# Patient Record
Sex: Female | Born: 2000 | Race: Black or African American | Hispanic: No | Marital: Married | Smoking: Never smoker
Health system: Southern US, Community
[De-identification: ages and names within clinical notes are randomized; demographics above are authoritative.]

## PROBLEM LIST (undated history)

## (undated) DIAGNOSIS — F4010 Social phobia, unspecified: Secondary | ICD-10-CM

## (undated) DIAGNOSIS — F39 Unspecified mood [affective] disorder: Secondary | ICD-10-CM

## (undated) DIAGNOSIS — F32A Depression, unspecified: Secondary | ICD-10-CM

## (undated) DIAGNOSIS — F419 Anxiety disorder, unspecified: Secondary | ICD-10-CM

## (undated) DIAGNOSIS — G90A Postural orthostatic tachycardia syndrome (POTS): Secondary | ICD-10-CM

## (undated) DIAGNOSIS — F329 Major depressive disorder, single episode, unspecified: Secondary | ICD-10-CM

## (undated) DIAGNOSIS — L309 Dermatitis, unspecified: Secondary | ICD-10-CM

## (undated) HISTORY — PX: NO PAST SURGERIES: SHX2092

## (undated) HISTORY — DX: Postural orthostatic tachycardia syndrome (POTS): G90.A

---
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
## 2020-06-30 NOTE — L&D Delivery Note (Signed)
Delivery Note Donna Diaz is a 20 y.o. G1P0 at [redacted]w[redacted]d admitted for IOL d/t A2DM.   GBS Status: Positive/-- (10/06 1615) Maximum Maternal Temperature: 100.5  Labor course: Initial SVE: 0/th/-3. Augmentation with: Pitocin, Cytotec, IP Foley, and SROM . She then progressed to complete.  ROM: 32h 59m with clear fluid  Birth: At 0349 a viable female was delivered via spontaneous vaginal delivery (Presentation: LOA). Nuchal cord present: Yes x 2. Shoulders not forthcoming, placed in McRobert's, still not forthcoming, so grasped posterior (Lt) axilla and rotated to deliver anterior arm. Nuchal x 2 reduced immediately after birth. Infant placed directly on mom's abdomen for bonding/skin-to-skin, baby dried and stimulated. Cord clamped x 2 after 1 minute and cut by FOB.  Cord blood collected.  The placenta separated spontaneously and delivered via gentle cord traction.  Pitocin infused rapidly IV per protocol.  Fundus firm with massage.  Placenta inspected and appears to be intact with a 3 VC.  Placenta/Cord with the following complications: velamentous insertion .  Cord pH: not done Sponge and instrument count were correct x2.  Intrapartum complications:  mild shoulder dystocia Anesthesia:  epidural and local for repair Episiotomy: none Lacerations:  2nd degree Suture Repair: 3.0 vicryl EBL (mL): 50   Infant: APGAR (1 MIN): 7   APGAR (5 MINS): 9   APGAR (10 MINS):    Infant weight: pending  Mom to postpartum.  Baby to Couplet care / Skin to Skin. 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  Cheral Marker CNM, [facility] 05/01/2021 4:14 AM

## 2020-12-31 ENCOUNTER — Inpatient Hospital Stay (HOSPITAL_COMMUNITY)
Admission: AD | Admit: 2020-12-31 | Discharge: 2020-12-31 | Disposition: A | Discharge status: Home / Self Care | Payer: Medicaid Other | Attending: Obstetrics & Gynecology | Admitting: Obstetrics & Gynecology

## 2020-12-31 ENCOUNTER — Encounter (HOSPITAL_COMMUNITY): Payer: Self-pay | Admitting: Obstetrics & Gynecology

## 2020-12-31 ENCOUNTER — Other Ambulatory Visit: Payer: Self-pay

## 2020-12-31 DIAGNOSIS — O368120 Decreased fetal movements, second trimester, not applicable or unspecified: Secondary | ICD-10-CM | POA: Diagnosis not present

## 2020-12-31 DIAGNOSIS — M7918 Myalgia, other site: Secondary | ICD-10-CM | POA: Diagnosis not present

## 2020-12-31 DIAGNOSIS — M549 Dorsalgia, unspecified: Secondary | ICD-10-CM | POA: Insufficient documentation

## 2020-12-31 DIAGNOSIS — R000 Tachycardia, unspecified: Secondary | ICD-10-CM | POA: Diagnosis not present

## 2020-12-31 DIAGNOSIS — R109 Unspecified abdominal pain: Secondary | ICD-10-CM | POA: Diagnosis not present

## 2020-12-31 DIAGNOSIS — O26892 Other specified pregnancy related conditions, second trimester: Secondary | ICD-10-CM | POA: Diagnosis not present

## 2020-12-31 DIAGNOSIS — O24112 Pre-existing diabetes mellitus, type 2, in pregnancy, second trimester: Secondary | ICD-10-CM | POA: Insufficient documentation

## 2020-12-31 DIAGNOSIS — E119 Type 2 diabetes mellitus without complications: Secondary | ICD-10-CM

## 2020-12-31 DIAGNOSIS — Z3A22 22 weeks gestation of pregnancy: Secondary | ICD-10-CM | POA: Diagnosis not present

## 2020-12-31 LAB — WET PREP, GENITAL
Clue Cells Wet Prep HPF POC: NONE SEEN
Sperm: NONE SEEN
Trich, Wet Prep: NONE SEEN
Yeast Wet Prep HPF POC: NONE SEEN

## 2020-12-31 LAB — URINALYSIS, ROUTINE W REFLEX MICROSCOPIC
Bilirubin Urine: NEGATIVE
Glucose, UA: NEGATIVE mg/dL
Hgb urine dipstick: NEGATIVE
Ketones, ur: NEGATIVE mg/dL
Nitrite: NEGATIVE
Protein, ur: NEGATIVE mg/dL
Specific Gravity, Urine: 1.011 (ref 1.005–1.030)
pH: 7 (ref 5.0–8.0)

## 2020-12-31 LAB — COMPREHENSIVE METABOLIC PANEL
ALT: 12 U/L (ref 0–44)
AST: 12 U/L — ABNORMAL LOW (ref 15–41)
Albumin: 3 g/dL — ABNORMAL LOW (ref 3.5–5.0)
Alkaline Phosphatase: 50 U/L (ref 38–126)
Anion gap: 8 (ref 5–15)
BUN: <5 mg/dL — ABNORMAL LOW (ref 6–20)
CO2: 22 mmol/L (ref 22–32)
Calcium: 9.1 mg/dL (ref 8.9–10.3)
Chloride: 104 mmol/L (ref 98–111)
Creatinine, Ser: 0.5 mg/dL (ref 0.44–1.00)
GFR, Estimated: >60 mL/min (ref >60)
Glucose, Bld: 91 mg/dL (ref 70–99)
Potassium: 3.9 mmol/L (ref 3.5–5.1)
Sodium: 134 mmol/L — ABNORMAL LOW (ref 135–145)
Total Bilirubin: 0.5 mg/dL (ref 0.3–1.2)
Total Protein: 6.5 g/dL (ref 6.5–8.1)

## 2020-12-31 LAB — CBC
HCT: 31.9 % — ABNORMAL LOW (ref 36.0–46.0)
Hemoglobin: 9.9 g/dL — ABNORMAL LOW (ref 12.0–15.0)
MCH: 23.5 pg — ABNORMAL LOW (ref 26.0–34.0)
MCHC: 31 g/dL (ref 30.0–36.0)
MCV: 75.8 fL — ABNORMAL LOW (ref 80.0–100.0)
Platelets: 307 10*3/uL (ref 150–400)
RBC: 4.21 MIL/uL (ref 3.87–5.11)
RDW: 15.4 % (ref 11.5–15.5)
WBC: 9.1 10*3/uL (ref 4.0–10.5)
nRBC: 0 % (ref 0.0–0.2)

## 2020-12-31 LAB — HEMOGLOBIN A1C
Hgb A1c MFr Bld: 6.7 % — ABNORMAL HIGH (ref 4.8–5.6)
Mean Plasma Glucose: 145.59 mg/dL

## 2020-12-31 LAB — GLUCOSE, CAPILLARY: Glucose-Capillary: 76 mg/dL (ref 70–99)

## 2020-12-31 LAB — POC URINE PREG, ED: Preg Test, Ur: POSITIVE — AB

## 2020-12-31 MED ORDER — ACETAMINOPHEN 500 MG PO TABS
1000.0000 mg | ORAL_TABLET | Freq: Once | ORAL | Status: AC
  Administered 2020-12-31: 1000 mg via ORAL
  Filled 2020-12-31: qty 2

## 2020-12-31 MED ORDER — ASPIRIN EC 81 MG PO TBEC: 81.0000 mg | DELAYED_RELEASE_TABLET | Freq: Every day | ORAL | 2 refills | Status: DC

## 2020-12-31 MED ORDER — POLYSACCHARIDE IRON COMPLEX 150 MG PO CAPS: 150.0000 mg | ORAL_CAPSULE | ORAL | 0 refills | Status: DC

## 2020-12-31 NOTE — ED Provider Notes (Signed)
Emergency Medicine Provider OB Triage Evaluation Note  Donna Diaz is a 20 y.o. female, currently [redacted] weeks pregnant with first pregnancy who presents to the emergency department with complaints of intermittent right-sided low back and abdominal pain.  Patient is concerned she is not feeling as much fetal movement as she had before.  Moved here from [place] 1 week ago was previously getting her OB care there, no known complications with pregnancy thus far.  Patient is very anxious and worried about the symptoms.  No vaginal bleeding or leakage of fluids.  No persistent abdominal pain or cramping.  Review of  Systems  Positive: abdominal pain, back pain Negative: vaginal bleeding, leakage of fluids  Physical Exam  BP 130/90   Pulse 99   Temp 98.4 F (36.9 C) (Oral)   Resp 18   SpO2 98%  General: Awake, no distress  HEENT: Atraumatic  Resp: Normal effort  Cardiac: Normal rate Abd: Nondistended, nontender  MSK: Moves all extremities without difficulty Neuro: Speech clear  Medical Decision Making  Pt evaluated for pregnancy concern and is stable for transfer to [facility]. Pt is in agreement with plan for transfer.  12:49 PM Discussed with [facility]U APP, Sam, who accepts patient in transfer.  Clinical Impression   1. Abdominal pain in pregnancy, second trimester        Legrand Rams 12/31/20 1251    Derwood Kaplan, MD 01/01/21 [phone]

## 2020-12-31 NOTE — MAU Note (Addendum)
Presents with c/o lower back pain that began approximately 1 month ago.  Reports back is constantly aching.  Denies VB or LOF.  Endorses +FM, but less than usual.  Reports has noted a decrease in FM. Also states hasn't has PNC in a month and wanted a check up, "peace of mind" visit. States would like be evaluated for heart issue, has been diagnoses with tachycardia and feels like heart races with movement, but has been amplified since the 2nd trimester.  Also notices at night, takes a while for heart to settle down.

## 2020-12-31 NOTE — MAU Provider Note (Signed)
History     CSN: [id number]  Arrival date and time: 12/31/20 1228   Event Date/Time   First Provider Initiated Contact with Patient 12/31/20 1630      Chief Complaint  Patient presents with   Back Pain   HPI Donna Diaz is a 20 y.o. G1P0 at 4230w6d by first trimester ultrasound. [facility] with chief complaints of back pain, DFM and requesting "a reassuring checkup". Her back pain is new onset this morning.  Locus of pain is RUQ of her back. She also endorses right mid-abdominal pain near her iliac crest which is "sharp" and occurs without any stimulation "sometimes I'm just sitting there and it happens. She denies recent physically demanding activity.  Prior to pregnancy patient reports primary exercise was walking. Patient states she sleeps on the left side of her bed and endorses recurrent twisting and physical demand when she tries to get out of bed.   DFM Patient states she has been able to feel her baby move "all day all the time" since early second trimester. She now only feels the baby move about 5 times during the day.  Diabetes Patient moved to [place] from [place] about one week ago. She was last seen for prenatal care one month ago. Patient endorses diagnosis of "Gestational Diabetes during my first trimester in May 2022". She reports that she checks her blood sugar on a regular basis and states she has always been well-controlled.   Tachycardia Patient reports history of Ventricular Tachycardia. She states she can sometimes feel that her pulse is racing. She states her OB provider in [place] encouraged her to "wear a heart monitor when I sleep" but patient moved out of state before this could be accomplished.  Patient states she completed a Medicaid request via a website but has not been able to coordinate prenatal care in [place] due to absence of pregnancy medicaid.  She denies lower abdominal pain, dysuria, vaginal bleeding, fever or recent  illness. She is remote from sexual intercourse.  OB History     Gravida  1   Para      Term      Preterm      AB      Living         SAB      IAB      Ectopic      Multiple      Live Births              Past Medical History:  Diagnosis Date   Anxiety    Asthma    Depression    Eczema    Mood disorder (HCC)    Social anxiety disorder     Past Surgical History:  Procedure Laterality Date   NO PAST SURGERIES      Family History  Problem Relation Age of Onset   Migraines Mother    Seizures Mother        past   Depression Mother    Anxiety disorder Mother    Depression Father    Anxiety disorder Father    Depression Sister    Anxiety disorder Sister    Migraines Maternal Aunt    Depression Maternal Grandmother    Anxiety disorder Maternal Grandmother    Multiple sclerosis Sister    Bipolar disorder Neg Hx    Schizophrenia Neg Hx    ADD / ADHD Neg Hx    Autism Neg Hx     Social  History   Tobacco Use   Smoking status: Passive Smoke Exposure - Never Smoker   Smokeless tobacco: Never  Substance Use Topics   Alcohol use: No   Drug use: No    Allergies: No Known Allergies  Medications Prior to Admission  Medication Sig Dispense Refill Last Dose   acetaminophen (TYLENOL) 500 MG tablet Take 1,000 mg by mouth every 6 (six) hours as needed.   12/30/2020   Multiple Vitamin (MULTIVITAMIN) tablet Take 1 tablet by mouth daily.   12/30/2020   Prenatal Vit-Fe Fumarate-FA (MULTIVITAMIN-PRENATAL) 27-0.8 MG TABS tablet Take 1 tablet by mouth daily at 12 noon.   12/30/2020   amitriptyline (ELAVIL) 25 MG tablet Take 1 tablet (25 mg total) by mouth at bedtime. (Patient not taking: Reported on 10/15/2017) 30 tablet 0    cetirizine (ZYRTEC) 10 MG tablet Take 1 tablet (10 mg total) by mouth daily. 30 tablet 3    fluticasone (FLONASE) 50 MCG/ACT nasal spray SHAKE LIQUID AND USE 1 SPRAY IN EACH NOSTRIL DAILY 16 g 0    meclizine (ANTIVERT) 12.5 MG tablet Take 1 tablet  (12.5 mg total) by mouth 3 (three) times daily as needed for dizziness. 30 tablet 0    ondansetron (ZOFRAN-ODT) 4 MG disintegrating tablet Take 1 tablet (4 mg total) by mouth every 8 (eight) hours as needed for nausea or vomiting. 12 tablet 0    PROVENTIL HFA 108 (90 Base) MCG/ACT inhaler INHALE 2 PUFFS INTO THE LUNGS EVERY 6 HOURS AS NEEDED FOR WHEEZING OR SHORTNESS OF BREATH 6.7 g 0    traZODone (DESYREL) 50 MG tablet Take 1 tablet (50 mg total) by mouth at bedtime. 30 tablet 0    triamcinolone (NASACORT) 55 MCG/ACT AERO nasal inhaler Place 2 sprays into the nose daily. 1 Inhaler 12     Review of Systems  Gastrointestinal:  Positive for abdominal pain.  Musculoskeletal:  Positive for back pain.  All other systems reviewed and are negative. Physical Exam   Blood pressure (!) 120/50, pulse 91, temperature 98.1 F (36.7 C), temperature source Oral, resp. rate 20, height 5\' 2"  (1.575 m), weight 102.3 kg, SpO2 98 %.  Physical Exam Vitals and nursing note reviewed. Exam conducted with a chaperone present.  Constitutional:      General: She is not in acute distress.    Appearance: Normal appearance. She is obese. She is not ill-appearing.  Cardiovascular:     Rate and Rhythm: Normal rate and regular rhythm.     Pulses: Normal pulses.     Heart sounds: Normal heart sounds.  Pulmonary:     Effort: Pulmonary effort is normal.     Breath sounds: Normal breath sounds.  Abdominal:     Tenderness: There is no abdominal tenderness.     Comments: Gravid  Skin:    Capillary Refill: Capillary refill takes less than 2 seconds.  Neurological:     Mental Status: She is alert and oriented to person, place, and time.  Psychiatric:        Mood and Affect: Mood normal.        Behavior: Behavior normal.        Thought Content: Thought content normal.        Judgment: Judgment normal.    MAU Course  Procedures  --Unable to view records from [facility] in Care Everywhere --ECG: NSR with  Sinus Arrhythmia. Reviewed by Dr. [facility], additional evaluation not indicated at this time --Discussed with patient that Diabetes diagnosed in first  trimester/May 2022 would be T2DM not Gestational. Please continue to check. Concern for consistency of home checks given recent move.  --Discussed risk factors for PEC prophylaxis. Not previously discussed with prenatal team. Pt agreeable to initiating today  --Routine ultrasound to screen for fetal aneuploidy not yet performed this pregnancy. No indication for MFM scan via MAU. Will order at discharge. Pt agreeable  Orders Placed This Encounter  Procedures   Wet prep, genital   [place] MFM OB COMP + 14 WK   Urinalysis, Routine w reflex microscopic Urine, Clean Catch   CBC   Comprehensive metabolic panel   Hemoglobin A1c   Glucose, capillary   Nursing communication   POC urine preg, ED   ED EKG   Discharge patient   Patient Vitals for the past 24 hrs:  BP Temp Temp src Pulse Resp SpO2 Height Weight  12/31/20 1705 (!) 127/55 -- -- 79 18 100 % -- --  12/31/20 1437 (!) 120/50 -- -- 91 -- -- -- --  12/31/20 1406 114/63 98.1 F (36.7 C) Oral 75 20 98 % -- --  12/31/20 1356 -- -- -- -- -- -- 5\' 2"  (1.575 m) 102.3 kg  12/31/20 1246 130/90 98.4 F (36.9 C) Oral 99 18 98 % -- --   Results for orders placed or performed during the hospital encounter of 12/31/20 (from the past 24 hour(s))  POC urine preg, ED     Status: Abnormal   Collection Time: 12/31/20  1:02 PM  Result Value Ref Range   Preg Test, Ur POSITIVE (A) NEGATIVE  Urinalysis, Routine w reflex microscopic Urine, Clean Catch     Status: Abnormal   Collection Time: 12/31/20  2:14 PM  Result Value Ref Range   Color, Urine YELLOW YELLOW   APPearance CLEAR CLEAR   Specific Gravity, Urine 1.011 1.005 - 1.030   pH 7.0 5.0 - 8.0   Glucose, UA NEGATIVE NEGATIVE mg/dL   Hgb urine dipstick NEGATIVE NEGATIVE   Bilirubin Urine NEGATIVE NEGATIVE   Ketones, ur NEGATIVE NEGATIVE mg/dL    Protein, ur NEGATIVE NEGATIVE mg/dL   Nitrite NEGATIVE NEGATIVE   Leukocytes,Ua SMALL (A) NEGATIVE   RBC / HPF 0-5 0 - 5 RBC/hpf   WBC, UA 0-5 0 - 5 WBC/hpf   Bacteria, UA RARE (A) NONE SEEN   Squamous Epithelial / LPF 0-5 0 - 5   Mucus PRESENT   Wet prep, genital     Status: Abnormal   Collection Time: 12/31/20  2:14 PM  Result Value Ref Range   Yeast Wet Prep HPF POC NONE SEEN NONE SEEN   Trich, Wet Prep NONE SEEN NONE SEEN   Clue Cells Wet Prep HPF POC NONE SEEN NONE SEEN   WBC, Wet Prep HPF POC MANY (A) NONE SEEN   Sperm NONE SEEN   CBC     Status: Abnormal   Collection Time: 12/31/20  3:22 PM  Result Value Ref Range   WBC 9.1 4.0 - 10.5 K/uL   RBC 4.21 3.87 - 5.11 MIL/uL   Hemoglobin 9.9 (L) 12.0 - 15.0 g/dL   HCT 03/03/21 (L) 90.2 - 40.9 %   MCV 75.8 (L) 80.0 - 100.0 fL   MCH 23.5 (L) 26.0 - 34.0 pg   MCHC 31.0 30.0 - 36.0 g/dL   RDW 73.5 32.9 - 92.4 %   Platelets 307 150 - 400 K/uL   nRBC 0.0 0.0 - 0.2 %  Comprehensive metabolic panel     Status: Abnormal  Collection Time: 12/31/20  3:22 PM  Result Value Ref Range   Sodium 134 (L) 135 - 145 mmol/L   Potassium 3.9 3.5 - 5.1 mmol/L   Chloride 104 98 - 111 mmol/L   CO2 22 22 - 32 mmol/L   Glucose, Bld 91 70 - 99 mg/dL   BUN <5 (L) 6 - 20 mg/dL   Creatinine, Ser 4.48 0.44 - 1.00 mg/dL   Calcium 9.1 8.9 - 18.5 mg/dL   Total Protein 6.5 6.5 - 8.1 g/dL   Albumin 3.0 (L) 3.5 - 5.0 g/dL   AST 12 (L) 15 - 41 U/L   ALT 12 0 - 44 U/L   Alkaline Phosphatase 50 38 - 126 U/L   Total Bilirubin 0.5 0.3 - 1.2 mg/dL   GFR, Estimated >63 >14 mL/min   Anion gap 8 5 - 15  Hemoglobin A1c     Status: Abnormal   Collection Time: 12/31/20  3:22 PM  Result Value Ref Range   Hgb A1c MFr Bld 6.7 (H) 4.8 - 5.6 %   Mean Plasma Glucose 145.59 mg/dL  Glucose, capillary     Status: None   Collection Time: 12/31/20  4:50 PM  Result Value Ref Range   Glucose-Capillary 76 70 - 99 mg/dL   Meds ordered this encounter  Medications    acetaminophen (TYLENOL) tablet 1,000 mg   aspirin EC 81 MG tablet    Sig: Take 1 tablet (81 mg total) by mouth daily. Take after 12 weeks for prevention of preeclampsia later in pregnancy    Dispense:  300 tablet    Refill:  2    Order Specific Question:   Supervising Provider    Answer:   Jaynie Collins A [[id number]]   iron polysaccharides (NIFEREX) 150 MG capsule    Sig: Take 1 capsule (150 mg total) by mouth every other day.    Dispense:  15 capsule    Refill:  0    Order Specific Question:   Supervising Provider    Answer:   Jaynie Collins A [[id number]]   Assessment and Plan  --20 y.o. G1P0 at [redacted]w[redacted]d by first trimester ultrasound --FHT 145 by doppler --T2DM, diet controlled --Hgb 9.9. Continue PNV with Fe, add Fe supplement every other day --bASA for PEC prophylaxis --Back pain musculoskeletal. Advised daily mild exercise, Tylenol PRN --Discharge home in stable condition   F/U: --T2DM will typically preclude patient from receiving care with [facility] --Contact information provided for OB offices with [facility] privileges --Order placed for anatomy scan with [facility]  Calvert Cantor, CNM 12/31/2020, 6:23 PM

## 2020-12-31 NOTE — ED Triage Notes (Signed)
Patient reports [redacted] weeks pregnant, recently moved from [place] and does not have an obgyn here. Having some back pain and does not feel the baby moving as much. Denies any vaginal bleeding.

## 2020-12-31 NOTE — Discharge Instructions (Addendum)
Safe Medications in Pregnancy    Acne: Benzoyl Peroxide Salicylic Acid  Backache/Headache: Tylenol: 2 regular strength every 4 hours OR              2 Extra strength every 6 hours  Colds/Coughs/Allergies: Benadryl (alcohol free) 25 mg every 6 hours as needed Breath right strips Claritin Cepacol throat lozenges Chloraseptic throat spray Cold-Eeze- up to three times per day Cough drops, alcohol free Flonase (by prescription only) Guaifenesin Mucinex Robitussin DM (plain only, alcohol free) Saline nasal spray/drops Sudafed (pseudoephedrine) & Actifed ** use only after [redacted] weeks gestation and if you do not have high blood pressure Tylenol Vicks Vaporub Zinc lozenges Zyrtec   Constipation: Colace Ducolax suppositories Fleet enema Glycerin suppositories Metamucil Milk of magnesia Miralax Senokot Smooth move tea  Diarrhea: Kaopectate Imodium A-D  *NO pepto Bismol  Hemorrhoids: Anusol Anusol HC Preparation H Tucks  Indigestion: Tums Maalox Mylanta Zantac  Pepcid  Insomnia: Benadryl (alcohol free) 25mg every 6 hours as needed Tylenol PM Unisom, no Gelcaps  Leg Cramps: Tums MagGel  Nausea/Vomiting:  Bonine Dramamine Emetrol Ginger extract Sea bands Meclizine  Nausea medication to take during pregnancy:  Unisom (doxylamine succinate 25 mg tablets) Take one tablet daily at bedtime. If symptoms are not adequately controlled, the dose can be increased to a maximum recommended dose of two tablets daily (1/2 tablet in the morning, 1/2 tablet mid-afternoon and one at bedtime). Vitamin B6 100mg tablets. Take one tablet twice a day (up to 200 mg per day).  Skin Rashes: Aveeno products Benadryl cream or 25mg every 6 hours as needed Calamine Lotion 1% cortisone cream  Yeast infection: Gyne-lotrimin 7 Monistat 7   **If taking multiple medications, please check labels to avoid duplicating the same active ingredients **take  medication as directed on the label ** Do not exceed 4000 mg of tylenol in 24 hours **Do not take medications that contain aspirin or ibuprofen   Prenatal Care Providers           [facility]  [address] [phone]  [facility]   [address]  [phone]  [facility] @ [place]       [address] [phone]            [facility] @ [place]     [address] [phone]          [facility] @ [place]   [address] [phone]  [facility]  [address] [phone]     [facility])  [address]   [phone]     [facility]  Phone: [phone]  [facility]  Phone: [phone]  [facility] Phone: [phone]  [facility] Phone: [phone]  [facility] Phone: [phone]  [facility] Phone: [phone]  [facility]  Phone: [phone]  

## 2021-01-01 LAB — GC/CHLAMYDIA PROBE AMP (~~LOC~~) NOT AT ARMC
Chlamydia: NEGATIVE
Comment: NEGATIVE
Comment: NORMAL
Neisseria Gonorrhea: NEGATIVE

## 2021-01-29 ENCOUNTER — Encounter: Payer: Medicaid Other | Admitting: Family Medicine

## 2021-01-29 DIAGNOSIS — Z3400 Encounter for supervision of normal first pregnancy, unspecified trimester: Secondary | ICD-10-CM | POA: Insufficient documentation

## 2021-01-30 ENCOUNTER — Encounter: Payer: Self-pay | Admitting: Obstetrics and Gynecology

## 2021-01-30 ENCOUNTER — Other Ambulatory Visit: Payer: Self-pay

## 2021-01-30 ENCOUNTER — Ambulatory Visit (INDEPENDENT_AMBULATORY_CARE_PROVIDER_SITE_OTHER): Payer: Medicaid Other | Admitting: Obstetrics and Gynecology

## 2021-01-30 VITALS — BP 113/72 | HR 106 | Wt 224.0 lb

## 2021-01-30 DIAGNOSIS — Z3A27 27 weeks gestation of pregnancy: Secondary | ICD-10-CM | POA: Insufficient documentation

## 2021-01-30 DIAGNOSIS — O24410 Gestational diabetes mellitus in pregnancy, diet controlled: Secondary | ICD-10-CM

## 2021-01-30 DIAGNOSIS — O24415 Gestational diabetes mellitus in pregnancy, controlled by oral hypoglycemic drugs: Secondary | ICD-10-CM | POA: Insufficient documentation

## 2021-01-30 DIAGNOSIS — O24419 Gestational diabetes mellitus in pregnancy, unspecified control: Secondary | ICD-10-CM | POA: Insufficient documentation

## 2021-01-30 DIAGNOSIS — F419 Anxiety disorder, unspecified: Secondary | ICD-10-CM

## 2021-01-30 DIAGNOSIS — J4520 Mild intermittent asthma, uncomplicated: Secondary | ICD-10-CM

## 2021-01-30 DIAGNOSIS — Z3400 Encounter for supervision of normal first pregnancy, unspecified trimester: Secondary | ICD-10-CM | POA: Diagnosis not present

## 2021-01-30 MED ORDER — GOJJI WEIGHT SCALE MISC: 1.0000 | 0 refills | Status: DC

## 2021-01-30 MED ORDER — BLOOD PRESSURE KIT DEVI: 1.0000 | 0 refills | Status: DC

## 2021-01-30 NOTE — Progress Notes (Signed)
NOB  transfer from [place]  [redacted]w[redacted]d MAU visit on 12/31/20.   Pt mother is present has a few concerns to discuss w/ provider regarding pt. Pt did not bring blood sugar log to today's.  LMP:07/24/20  Last Pap: N/A due to age.   Genetic Screening:Has done already was low risk FEMALE   CC: Vaginal yeast infection pt currently got Monistat 1 day. Notes vaginal swelling.   Per pt she has no concerns today.   Requesting referral w/ Sue Lush. Pt made aware of navigator services and given tablet to fill out info.   B/P cuff provided.

## 2021-01-30 NOTE — Progress Notes (Signed)
PRENATAL VISIT NOTE  Subjective:  Donna Diaz is a 20 y.o. G1P0 at [redacted]w[redacted]d being seen today for ongoing prenatal care.  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  The pt is a late transfer of care from [place].  Chart was reviewed in detail.  Pt failed early 1 hour GTT and was diagnosed as gestational diabetic. A1c here was 6.7.  ? Pre-existing diabetic? Pt has been keeping blood sugars, but did not get formal diabetes education.  Pt does have strong family history of diabetes.  Per pt FBS 100-111 and PPBS 120-130; however, pt brought in no blood sugars.  Pt was significantly emotional during the exam.  Her mother was present as well.  There has been a 1-2 month gap  from her last [facility].   Patient doing well with no acute concerns today. She reports no complaints.  Contractions: Not present. Vag. Bleeding: None.  Movement: Present. Denies leaking of fluid.   The following portions of the patient's history were reviewed and updated as appropriate: allergies, current medications, past family history, past medical history, past social history, past surgical history and problem list. Problem list updated.  Objective:   Vitals:   01/30/21 0915  BP: 113/72  Pulse: (!) 106  Weight: 224 lb (101.6 kg)    Fetal Status: Fetal Heart Rate (bpm): 142   Movement: Present     General:  Alert, oriented and cooperative. Patient is in no acute distress.  Skin: Skin is  warm and dry. No rash noted.   Cardiovascular: Normal heart rate noted  Respiratory: Normal respiratory effort, no problems with respiration noted  Abdomen: Soft, gravid, appropriate for gestational age.  Pain/Pressure: Absent     Pelvic: Cervical exam deferred        Extremities: Normal range of motion.  Edema: None  Mental Status:  Normal mood and affect. Normal behavior. Normal judgment and thought content.   Assessment and Plan:  Pregnancy: G1P0 at [redacted]w[redacted]d  1. Supervision of normal first pregnancy, antepartum Begin routine care, return in 1 week to review blood sugars - Culture, OB Urine - Obstetric Panel, Including HIV - Hepatitis C Antibody - [place] MFM OB COMP + 14 WK; Future - Misc. Devices (GOJJI WEIGHT SCALE) MISC; 1 Device by Does not apply route once a week.  Dispense: 1 each; Refill: 0 - Enroll Patient in PreNatal Babyscripts - Referral to Nutrition and Diabetes Services  2. Mild intermittent asthma without complication No issues  3. [redacted] weeks gestation of pregnancy   4. Diet controlled gestational diabetes mellitus (GDM) in second trimester Continue checking blood sugars, review blood sugars at next visit - Referral to Nutrition and Diabetes Services  5. Anxiety  - Ambulatory referral to Integrated Behavioral Health  Preterm labor symptoms and general obstetric precautions including but not limited to vaginal bleeding, contractions, leaking of fluid and fetal movement were reviewed in detail with the patient.  Please refer to After Visit Summary for other counseling recommendations.   Return in about 1 week (around 02/06/2021) for in person, [facility].   Mariel Aloe, MD Faculty Attending Center  for [employer]

## 2021-01-31 ENCOUNTER — Other Ambulatory Visit: Payer: Self-pay

## 2021-02-01 LAB — CULTURE, OB URINE

## 2021-02-01 LAB — URINE CULTURE, OB REFLEX

## 2021-02-02 LAB — OBSTETRIC PANEL, INCLUDING HIV
Antibody Screen: NEGATIVE
Basophils Absolute: 0 10*3/uL (ref 0.0–0.2)
Basos: 0 %
EOS (ABSOLUTE): 0.1 10*3/uL (ref 0.0–0.4)
Eos: 1 %
HIV Screen 4th Generation wRfx: NONREACTIVE
Hematocrit: 31 % — ABNORMAL LOW (ref 34.0–46.6)
Hemoglobin: 9.6 g/dL — ABNORMAL LOW (ref 11.1–15.9)
Hepatitis B Surface Ag: NEGATIVE
Immature Grans (Abs): 0 10*3/uL (ref 0.0–0.1)
Immature Granulocytes: 0 %
Lymphocytes Absolute: 2 10*3/uL (ref 0.7–3.1)
Lymphs: 23 %
MCH: 22.6 pg — ABNORMAL LOW (ref 26.6–33.0)
MCHC: 31 g/dL — ABNORMAL LOW (ref 31.5–35.7)
MCV: 73 fL — ABNORMAL LOW (ref 79–97)
Monocytes Absolute: 0.6 10*3/uL (ref 0.1–0.9)
Monocytes: 7 %
Neutrophils Absolute: 6 10*3/uL (ref 1.4–7.0)
Neutrophils: 69 %
Platelets: 294 10*3/uL (ref 150–450)
RBC: 4.24 x10E6/uL (ref 3.77–5.28)
RDW: 15.1 % (ref 11.7–15.4)
RPR Ser Ql: NONREACTIVE
Rh Factor: POSITIVE
Rubella Antibodies, IGG: 3.09 index (ref >0.99)
WBC: 8.7 10*3/uL (ref 3.4–10.8)

## 2021-02-02 LAB — HEPATITIS C ANTIBODY: Hep C Virus Ab: <0.1 s/co ratio (ref 0.0–0.9)

## 2021-02-04 ENCOUNTER — Telehealth (INDEPENDENT_AMBULATORY_CARE_PROVIDER_SITE_OTHER): Payer: Medicaid Other | Admitting: Licensed Clinical Social Worker

## 2021-02-04 DIAGNOSIS — O99340 Other mental disorders complicating pregnancy, unspecified trimester: Secondary | ICD-10-CM | POA: Diagnosis not present

## 2021-02-04 DIAGNOSIS — F419 Anxiety disorder, unspecified: Secondary | ICD-10-CM | POA: Diagnosis not present

## 2021-02-04 NOTE — Progress Notes (Addendum)
Integrated Behavioral Health via Telemedicine Visit  02/04/2021 Donna Diaz [id number]  Number of Integrated Behavioral Health visits: 1 Session Start time: 9:04am  Session End time: 9:23am Total time: 19 mins via mychart video  Referring Provider: Hazle Coca MD Patient/Family location: Home  [facility] Provider location: [facility]  All persons participating in visit: Donna Diaz and LCSWA A. Felton Clinton  Types of Service: General Behavioral Integrated Care (BHI)  I connected with Donna Diaz and/or Donna Diaz's n/a via  Telephone or Video Enabled Telemedicine Application  (Video is Caregility application) and verified that I am speaking with the correct person using two identifiers. Discussed confidentiality: Yes   I discussed the limitations of telemedicine and the availability of in person appointments.  Discussed there is a possibility of technology failure and discussed alternative modes of communication if that failure occurs.  I discussed that engaging in this telemedicine visit, they consent to the provision of behavioral healthcare and the services will be billed under their insurance.  Patient and/or legal guardian expressed understanding and consented to Telemedicine visit: Yes   Presenting Concerns: Patient and/or family reports the following symptoms/concerns: anxiety Duration of problem: over one year ; Severity of problem: mild  Patient and/or Family's Strengths/Protective Factors: Concrete supports in place (healthy food, safe environments, etc.)  Goals Addressed: Patient will:  Reduce symptoms of: anxiety   Increase knowledge and/or ability of: coping skills   Demonstrate ability to: Increase healthy adjustment to current life circumstances  Progress towards Goals: Ongoing  Interventions: Interventions utilized:  Supportive Counseling Standardized Assessments completed: PHQ 9   Assessment: Patient currently experiencing anxiety affecting pregnancy.   Patient may  benefit from integrated behavioral health.  Plan: Follow up with behavioral health clinician on : 3 weeks via mychart  Behavioral recommendations: Develop co parenting plan with partner, engage in stress reducing activities, and prioritize task to prevent burnout Referral(s): Integrated [employer] (In Clinic)  I discussed the assessment and treatment plan with the patient and/or parent/guardian. They were provided an opportunity to ask questions and all were answered. They agreed with the plan and demonstrated an understanding of the instructions.   They were advised to call back or seek an in-person evaluation if the symptoms worsen or if the condition fails to improve as anticipated.  Gwyndolyn Saxon, LCSW

## 2021-02-06 ENCOUNTER — Other Ambulatory Visit: Payer: Self-pay

## 2021-02-06 ENCOUNTER — Encounter: Payer: Self-pay | Admitting: Obstetrics

## 2021-02-06 ENCOUNTER — Ambulatory Visit (INDEPENDENT_AMBULATORY_CARE_PROVIDER_SITE_OTHER): Payer: Medicaid Other | Admitting: Obstetrics and Gynecology

## 2021-02-06 VITALS — BP 124/63 | HR 99 | Wt 221.1 lb

## 2021-02-06 DIAGNOSIS — Z3A28 28 weeks gestation of pregnancy: Secondary | ICD-10-CM

## 2021-02-06 DIAGNOSIS — J4520 Mild intermittent asthma, uncomplicated: Secondary | ICD-10-CM

## 2021-02-06 DIAGNOSIS — Z3403 Encounter for supervision of normal first pregnancy, third trimester: Secondary | ICD-10-CM

## 2021-02-06 DIAGNOSIS — Z23 Encounter for immunization: Secondary | ICD-10-CM | POA: Diagnosis not present

## 2021-02-06 DIAGNOSIS — O24415 Gestational diabetes mellitus in pregnancy, controlled by oral hypoglycemic drugs: Secondary | ICD-10-CM

## 2021-02-06 MED ORDER — POLYSACCHARIDE IRON COMPLEX 150 MG PO CAPS: 150.0000 mg | ORAL_CAPSULE | ORAL | 11 refills | Status: AC

## 2021-02-06 MED ORDER — METFORMIN HCL 500 MG PO TABS: 500.0000 mg | ORAL_TABLET | Freq: Two times a day (BID) | ORAL | 5 refills | Status: DC

## 2021-02-06 NOTE — Progress Notes (Signed)
   PRENATAL VISIT NOTE  Subjective:  Donna Diaz is a 20 y.o. G1P0 at [redacted]w[redacted]d being seen today for ongoing prenatal care.  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  Patient doing well with no acute concerns today. She reports no complaints.  Contractions: Not present. Vag. Bleeding: None.  Movement: Present. Denies leaking of fluid.   Pt in much better spirits today.  The following portions of the patient's history were reviewed and updated as appropriate: allergies, current medications, past family history, past medical history, past social history, past surgical history and problem list. Problem list updated.  Objective:   Vitals:   02/06/21 0856  BP: 124/63  Pulse: 99  Weight: 221 lb 1.6 oz (100.3 kg)    Fetal Status: Fetal Heart Rate (bpm): 135 Fundal Height: 28 cm Movement: Present     General:  Alert, oriented and cooperative. Patient is in no acute distress.  Skin: Skin is warm and dry. No rash noted.   Cardiovascular: Normal heart rate noted  Respiratory: Normal respiratory effort, no problems with respiration noted  Abdomen: Soft, gravid, appropriate for gestational age.  Pain/Pressure: Absent     Pelvic: Cervical exam deferred        Extremities: Normal range of motion.  Edema: None  Mental Status:  Normal mood and affect. Normal  behavior. Normal judgment and thought content.   Assessment and Plan:  Pregnancy: G1P0 at [redacted]w[redacted]d  1. Encounter for supervision of normal first pregnancy in third trimester Continue routine care - Tdap vaccine greater than or equal to 7yo IM - iron polysaccharides (NIFEREX) 150 MG capsule; Take 1 capsule (150 mg total) by mouth every other day.  Dispense: 15 capsule; Refill: 11  2. Gestational diabetes mellitus (GDM) in third trimester controlled on oral hypoglycemic drug FBS:99-109 PPBS: 93-148 Many elevated blood sugars are from poor dietary choices, pt has diabetic teaching in 1 week Will start metformin for blood sugar control Growth scan on 02/27/21 - metFORMIN (GLUCOPHAGE) 500 MG tablet; Take 1 tablet (500 mg total) by mouth 2 (two) times daily with a meal.  Dispense: 60 tablet; Refill: 5  3. [redacted] weeks gestation of pregnancy   4. Mild intermittent asthma without complication No issue  Preterm labor symptoms and general obstetric precautions including but not limited to vaginal bleeding, contractions, leaking of fluid and fetal movement were reviewed in detail with the patient.  Please refer to After Visit Summary for other counseling recommendations.   Return in about 2 weeks (around 02/20/2021) for [facility], in person.   Mariel Aloe, MD Faculty Attending Center for [facility]

## 2021-02-06 NOTE — Progress Notes (Signed)
Pt presents for ROB CBG readings available  Tdap given today L Del no difficulties

## 2021-02-13 ENCOUNTER — Ambulatory Visit: Payer: Medicaid Other

## 2021-02-21 ENCOUNTER — Ambulatory Visit (INDEPENDENT_AMBULATORY_CARE_PROVIDER_SITE_OTHER): Payer: Medicaid Other | Admitting: Obstetrics and Gynecology

## 2021-02-21 ENCOUNTER — Other Ambulatory Visit: Payer: Self-pay

## 2021-02-21 VITALS — BP 109/67 | HR 103 | Wt 221.5 lb

## 2021-02-21 DIAGNOSIS — Z3403 Encounter for supervision of normal first pregnancy, third trimester: Secondary | ICD-10-CM

## 2021-02-21 DIAGNOSIS — O24415 Gestational diabetes mellitus in pregnancy, controlled by oral hypoglycemic drugs: Secondary | ICD-10-CM

## 2021-02-21 DIAGNOSIS — Z3A30 30 weeks gestation of pregnancy: Secondary | ICD-10-CM | POA: Insufficient documentation

## 2021-02-21 MED ORDER — ONETOUCH ULTRASOFT LANCETS MISC: 12 refills | Status: DC

## 2021-02-21 MED ORDER — GLUCOSE BLOOD VI STRP: ORAL_STRIP | 12 refills | Status: DC

## 2021-02-21 NOTE — Addendum Note (Signed)
Addended by: Natale Milch D on: 02/21/2021 02:54 PM   Modules accepted: Orders

## 2021-02-21 NOTE — Progress Notes (Signed)
   PRENATAL VISIT NOTE  Subjective:  Donna Diaz is a 20 y.o. G1P0 at [redacted]w[redacted]d being seen today for ongoing prenatal care.  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  Patient doing well with no acute concerns today. She reports no complaints.  Contractions: Not present. Vag. Bleeding: None.  Movement: Present. Denies leaking of fluid.   The following portions of the patient's history were reviewed and updated as appropriate: allergies, current medications, past family history, past medical history, past social history, past surgical history and problem list. Problem list updated.  Objective:   Vitals:   02/21/21 1335  BP: 109/67  Pulse: (!) 103  Weight: 221 lb 8 oz (100.5 kg)    Fetal Status: Fetal Heart Rate (bpm): 140 Fundal Height: 33 cm Movement: Present     General:  Alert, oriented and cooperative. Patient is in no acute distress.  Skin: Skin is warm and dry. No rash noted.   Cardiovascular: Normal heart rate noted  Respiratory: Normal respiratory effort, no problems with respiration noted  Abdomen: Soft, gravid, appropriate for gestational age.  Pain/Pressure: Absent     Pelvic: Cervical exam deferred        Extremities: Normal range of motion.  Edema: None  Mental Status:  Normal mood and affect. Normal  behavior. Normal judgment and thought content.   Assessment and Plan:  Pregnancy: G1P0 at [redacted]w[redacted]d  1. Encounter for supervision of normal first pregnancy in third trimester Continue routine  medical care  2. [redacted] weeks gestation of pregnancy   3. Gestational diabetes mellitus (GDM) in third trimester controlled on oral hypoglycemic drug FBS:93-119 PPBS: 104-139  Pt advised to increase  metformin to 1000 mg BID, if ineffective will need to calculate new insulin regimen.  - US FETAL BPP W/NONSTRESS; Future  Preterm labor symptoms and general obstetric precautions including but not limited to vaginal bleeding, contractions, leaking of fluid and fetal movement were reviewed in detail with the patient.  Please refer to After Visit Summary for other counseling recommendations.   Return in about 2 weeks (around 03/07/2021) for [facility], in person.   Mariel Aloe, MD Faculty Attending Center for [facility]

## 2021-02-21 NOTE — Progress Notes (Signed)
Pt reports fetal movement, denies pain.  

## 2021-02-25 ENCOUNTER — Telehealth: Payer: Self-pay | Admitting: Licensed Clinical Social Worker

## 2021-02-25 ENCOUNTER — Encounter: Payer: Medicaid Other | Admitting: Licensed Clinical Social Worker

## 2021-02-25 NOTE — Telephone Encounter (Signed)
Called pt regarding scheduled mychart visit. Left message for callback  

## 2021-02-27 ENCOUNTER — Other Ambulatory Visit: Payer: Self-pay | Admitting: Obstetrics and Gynecology

## 2021-02-27 ENCOUNTER — Other Ambulatory Visit: Payer: Self-pay

## 2021-02-27 ENCOUNTER — Ambulatory Visit: Payer: Medicaid Other | Attending: Obstetrics and Gynecology

## 2021-02-27 ENCOUNTER — Ambulatory Visit (HOSPITAL_BASED_OUTPATIENT_CLINIC_OR_DEPARTMENT_OTHER): Payer: Medicaid Other | Admitting: Maternal & Fetal Medicine

## 2021-02-27 DIAGNOSIS — O24415 Gestational diabetes mellitus in pregnancy, controlled by oral hypoglycemic drugs: Secondary | ICD-10-CM | POA: Diagnosis present

## 2021-02-27 DIAGNOSIS — Z3400 Encounter for supervision of normal first pregnancy, unspecified trimester: Secondary | ICD-10-CM | POA: Diagnosis present

## 2021-02-27 DIAGNOSIS — Z3A31 31 weeks gestation of pregnancy: Secondary | ICD-10-CM | POA: Insufficient documentation

## 2021-02-27 DIAGNOSIS — O99213 Obesity complicating pregnancy, third trimester: Secondary | ICD-10-CM | POA: Insufficient documentation

## 2021-02-27 DIAGNOSIS — Z363 Encounter for antenatal screening for malformations: Secondary | ICD-10-CM | POA: Diagnosis not present

## 2021-02-27 DIAGNOSIS — O43123 Velamentous insertion of umbilical cord, third trimester: Secondary | ICD-10-CM

## 2021-02-27 DIAGNOSIS — E669 Obesity, unspecified: Secondary | ICD-10-CM

## 2021-02-27 NOTE — Progress Notes (Signed)
MFM Brief Note  Donna Diaz is a 20 yo G1P0 at 31w 1d who his here for a detailed exam. She is a late transfer of care from [place]. She had a elevated hgbA1c of 6.7 and a failed 1hr GTT.  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 The placental insertion of the umbilical cord (PCI) may occur centrally into the placental disk, eccentrically at the margin, or into the membranes beyond the margin of the placenta (so-called velamentous insertion, or VCI). The developmental dynamics that determine the directions of growth of the placental disk and the relative point of insertion of the umbilical vessels as the placental disk expands are poorly documented and understood.    I explained that VCI is not associated with fetal structural anomalies. VCI increases risk of umbilical cord avulsion during placental delivery. Central and marginal PCI have no associations with abnormal fetal outcome. VCI has been associated with low birth weight, prematurity, and abnormal fetal heart patterns in labor. VCI in the lower uterine segment has been associated with non-reassuring fetal heart rate patterns and emergency cesarean sections. Today we observed the VCI near the posterior fundal portion of the placenta.   Donna Diaz has know gestational diabetes managed with metformin. Most recent visit with Dr. Donavan Foil on 08/25 her metformin was increased to 1000 mg BID. Donna Diaz reports that her blood sugars have improved since this change but her fastings are still a bit elevated. We discuss nutrition and exercise modifications that can help her including no meals after 7 pm, reducing caloric drinks and walking 30 minutes after dinner. She conveyed that she will attempt to do that.  I spent 30 minutes with > 50% in face to face  consultation

## 2021-02-28 ENCOUNTER — Telehealth: Payer: Self-pay | Admitting: *Deleted

## 2021-02-28 ENCOUNTER — Other Ambulatory Visit: Payer: Self-pay | Admitting: *Deleted

## 2021-02-28 ENCOUNTER — Encounter: Payer: Self-pay | Admitting: *Deleted

## 2021-02-28 DIAGNOSIS — O24415 Gestational diabetes mellitus in pregnancy, controlled by oral hypoglycemic drugs: Secondary | ICD-10-CM

## 2021-02-28 DIAGNOSIS — O24419 Gestational diabetes mellitus in pregnancy, unspecified control: Secondary | ICD-10-CM

## 2021-02-28 HISTORY — DX: Gestational diabetes mellitus in pregnancy, unspecified control: O24.419

## 2021-02-28 MED ORDER — ACCU-CHEK SOFTCLIX LANCETS MISC: 1.0000 | Freq: Four times a day (QID) | 12 refills | Status: DC

## 2021-02-28 MED ORDER — ACCU-CHEK GUIDE W/DEVICE KIT: 1.0000 | PACK | Freq: Four times a day (QID) | 0 refills | Status: DC

## 2021-02-28 MED ORDER — ACCU-CHEK GUIDE VI STRP
ORAL_STRIP | 12 refills | Status: DC
Start: 2021-02-28 — End: 2021-05-02

## 2021-02-28 NOTE — Telephone Encounter (Signed)
Returned TC to patient. Patient says she does not have the right CBG test strips and lancets for her One Touch Ultra 2 meter from a previous provider. Supplies for One Touch Ultra 2 meter not covered by patient's insurance. Orders placed for Accu check glucose meter, strips, and lancets per previous conversation with patient.

## 2021-03-06 ENCOUNTER — Ambulatory Visit (INDEPENDENT_AMBULATORY_CARE_PROVIDER_SITE_OTHER): Payer: Medicaid Other

## 2021-03-06 ENCOUNTER — Other Ambulatory Visit: Payer: Self-pay

## 2021-03-06 ENCOUNTER — Other Ambulatory Visit: Payer: Medicaid Other

## 2021-03-06 ENCOUNTER — Ambulatory Visit (INDEPENDENT_AMBULATORY_CARE_PROVIDER_SITE_OTHER): Payer: Medicaid Other | Admitting: General Practice

## 2021-03-06 DIAGNOSIS — O24415 Gestational diabetes mellitus in pregnancy, controlled by oral hypoglycemic drugs: Secondary | ICD-10-CM | POA: Diagnosis not present

## 2021-03-06 NOTE — Progress Notes (Signed)
Pt informed that the ultrasound is considered a limited OB ultrasound and is not intended to be a complete ultrasound exam.  Patient also informed that the ultrasound is not being completed with the intent of assessing for fetal or placental anomalies or any pelvic abnormalities.  Explained that the purpose of today's ultrasound is to assess for  BPP, presentation, and AFI.  Patient acknowledges the purpose of the exam and the limitations of the study.     Chase Caller RN BSN 03/06/21

## 2021-03-07 ENCOUNTER — Encounter: Payer: Medicaid Other | Admitting: Obstetrics & Gynecology

## 2021-03-12 ENCOUNTER — Other Ambulatory Visit: Payer: Medicaid Other

## 2021-03-12 ENCOUNTER — Ambulatory Visit (INDEPENDENT_AMBULATORY_CARE_PROVIDER_SITE_OTHER): Payer: Medicaid Other | Admitting: Obstetrics & Gynecology

## 2021-03-12 ENCOUNTER — Other Ambulatory Visit: Payer: Self-pay

## 2021-03-12 ENCOUNTER — Ambulatory Visit (INDEPENDENT_AMBULATORY_CARE_PROVIDER_SITE_OTHER): Payer: Medicaid Other

## 2021-03-12 VITALS — BP 103/66 | HR 108 | Wt 227.0 lb

## 2021-03-12 DIAGNOSIS — O24415 Gestational diabetes mellitus in pregnancy, controlled by oral hypoglycemic drugs: Secondary | ICD-10-CM | POA: Diagnosis not present

## 2021-03-12 DIAGNOSIS — Z3403 Encounter for supervision of normal first pregnancy, third trimester: Secondary | ICD-10-CM

## 2021-03-12 DIAGNOSIS — Z3A33 33 weeks gestation of pregnancy: Secondary | ICD-10-CM | POA: Diagnosis not present

## 2021-03-12 NOTE — Progress Notes (Signed)
   PRENATAL VISIT NOTE  Subjective:  Donna Diaz is a 20 y.o. G1P0 at [redacted]w[redacted]d being seen today for ongoing prenatal care.  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  Patient reports no complaints.  Contractions: Not present. Vag. Bleeding: None.  Movement: Present. Denies leaking of fluid.   The following portions of the patient's history were reviewed and updated as appropriate: allergies, current medications, past family history, past medical history, past social history, past surgical history and problem list.   Objective:   Vitals:   03/12/21 1356  BP: 103/66  Pulse: (!) 108  Weight: 227 lb (103 kg)    Fetal Status:     Movement: Present     General:  Alert, oriented and cooperative. Patient is in no acute distress.  Skin: Skin is warm and dry. No rash noted.   Cardiovascular: Normal heart rate noted  Respiratory: Normal respiratory effort, no problems with respiration noted  Abdomen: Soft, gravid, appropriate for gestational age.  Pain/Pressure: Present     Pelvic: Cervical exam deferred        Extremities: Normal range of motion.  Edema: None  Mental Status: Normal mood and affect. Normal behavior. Normal judgment and thought content.   Assessment and Plan:  Pregnancy: G1P0 at [redacted]w[redacted]d 1. Gestational diabetes mellitus (GDM) in third trimester controlled on oral hypoglycemic drug Reacive with m Normal  AFI - Fetal nonstress test - US OB Limited; Future  2. Encounter for supervision of normal first pregnancy in third trimester Good glucose control  Preterm labor symptoms and general obstetric precautions including but not limited to vaginal bleeding, contractions, leaking of fluid and fetal movement were reviewed in detail with the patient. Please refer to After Visit Summary for other counseling recommendations.   Return in about 2 weeks (around 03/26/2021) for BPP weekly.  Future Appointments  Date Time Provider Department Center  03/13/2021  2:45 PM [facility] GDM CLASS [facility] NDM  03/21/2021  2:15 PM [facility] NST [facility]  03/27/2021  1:30 PM Hermina Staggers, MD [facility] None  03/27/2021  3:00 PM [facility] NURSE [facility]  03/27/2021  3:15 PM [facility] US2 [facility]    Scheryl Darter, MD

## 2021-03-12 NOTE — Progress Notes (Signed)
+   Fetal movement. Blood sugars are running ok per patient. Has glucose logs for review.

## 2021-03-12 NOTE — Progress Notes (Signed)
AFI 17.9

## 2021-03-13 ENCOUNTER — Encounter: Payer: Self-pay | Admitting: Dietician

## 2021-03-13 ENCOUNTER — Other Ambulatory Visit: Payer: Medicaid Other

## 2021-03-13 ENCOUNTER — Encounter: Payer: Medicaid Other | Attending: Obstetrics and Gynecology | Admitting: Dietician

## 2021-03-13 DIAGNOSIS — O24415 Gestational diabetes mellitus in pregnancy, controlled by oral hypoglycemic drugs: Secondary | ICD-10-CM | POA: Insufficient documentation

## 2021-03-13 NOTE — Progress Notes (Signed)
Patient was seen on 03/13/2021 for Gestational Diabetes self-management class at the [facility]. The following learning objectives were met by the patient during this course:  States the definition of Gestational Diabetes States why dietary management is important in controlling blood glucose Describes the effects each nutrient has on blood glucose levels Demonstrates ability to create a balanced meal plan Demonstrates carbohydrate counting  States when to check blood glucose levels Demonstrates proper blood glucose monitoring techniques States the effect of stress and exercise on blood glucose levels States the importance of limiting caffeine and abstaining from alcohol and smoking  Blood glucose monitor:  Accu Chek from home Blood glucose reading: 90  Patient instructed to monitor glucose levels: FBS: 60 - <90 1 hour: <140 2 hour: <120  *Patient received handouts: Nutrition Diabetes and Pregnancy Carbohydrate Counting List  Patient will be seen for follow-up as needed.

## 2021-03-14 ENCOUNTER — Other Ambulatory Visit: Payer: Medicaid Other

## 2021-03-21 ENCOUNTER — Other Ambulatory Visit: Payer: Self-pay

## 2021-03-21 ENCOUNTER — Ambulatory Visit: Payer: Medicaid Other | Admitting: *Deleted

## 2021-03-21 ENCOUNTER — Ambulatory Visit (INDEPENDENT_AMBULATORY_CARE_PROVIDER_SITE_OTHER): Payer: Medicaid Other

## 2021-03-21 VITALS — BP 110/56 | HR 90 | Wt 222.0 lb

## 2021-03-21 DIAGNOSIS — Z3A34 34 weeks gestation of pregnancy: Secondary | ICD-10-CM

## 2021-03-21 DIAGNOSIS — O24415 Gestational diabetes mellitus in pregnancy, controlled by oral hypoglycemic drugs: Secondary | ICD-10-CM

## 2021-03-21 NOTE — Progress Notes (Signed)

## 2021-03-27 ENCOUNTER — Other Ambulatory Visit: Payer: Self-pay | Admitting: Maternal & Fetal Medicine

## 2021-03-27 ENCOUNTER — Encounter: Payer: Self-pay | Admitting: Obstetrics and Gynecology

## 2021-03-27 ENCOUNTER — Ambulatory Visit: Payer: Medicaid Other | Admitting: *Deleted

## 2021-03-27 ENCOUNTER — Ambulatory Visit: Payer: Medicaid Other | Attending: Maternal & Fetal Medicine

## 2021-03-27 ENCOUNTER — Ambulatory Visit (INDEPENDENT_AMBULATORY_CARE_PROVIDER_SITE_OTHER): Payer: Medicaid Other | Admitting: Obstetrics and Gynecology

## 2021-03-27 ENCOUNTER — Other Ambulatory Visit: Payer: Self-pay

## 2021-03-27 ENCOUNTER — Encounter: Payer: Self-pay | Admitting: *Deleted

## 2021-03-27 VITALS — BP 119/57 | HR 94

## 2021-03-27 VITALS — BP 122/75 | HR 106 | Wt 225.7 lb

## 2021-03-27 DIAGNOSIS — Z3403 Encounter for supervision of normal first pregnancy, third trimester: Secondary | ICD-10-CM

## 2021-03-27 DIAGNOSIS — O99213 Obesity complicating pregnancy, third trimester: Secondary | ICD-10-CM | POA: Diagnosis not present

## 2021-03-27 DIAGNOSIS — O24415 Gestational diabetes mellitus in pregnancy, controlled by oral hypoglycemic drugs: Secondary | ICD-10-CM | POA: Insufficient documentation

## 2021-03-27 DIAGNOSIS — E669 Obesity, unspecified: Secondary | ICD-10-CM

## 2021-03-27 DIAGNOSIS — O43123 Velamentous insertion of umbilical cord, third trimester: Secondary | ICD-10-CM | POA: Insufficient documentation

## 2021-03-27 DIAGNOSIS — Z3A35 35 weeks gestation of pregnancy: Secondary | ICD-10-CM | POA: Diagnosis not present

## 2021-03-27 NOTE — Patient Instructions (Addendum)
Vaginal Delivery Vaginal delivery means that you give birth by pushing your baby out of your birth canal (vagina). Your health care team will help you before, during, and after vaginal delivery. Birth experiences are unique for every woman and every pregnancy, and birth experiences vary depending on where you choose to give birth. What are the risks and benefits? Generally, this is safe. However, problems may occur, including: Bleeding. Infection. Damage to other structures such as vaginal tearing. Allergic reactions to medicines. Despite the risks, benefits of vaginal delivery include less risk of bleeding and infection and a shorter recovery time compared to a Cesarean delivery. Cesarean delivery, or C-section, is the surgical delivery of a baby. What happens when I arrive at the birth center or hospital? Once you are in labor and have been admitted into the hospital or birth center, your health care team may: Review your pregnancy history and any concerns that you have. Talk with you about your birth plan and discuss pain control options. Check your blood pressure, breathing, and heartbeat. Assess your baby's heartbeat. Monitor your uterus for contractions. Check whether your bag of water (amniotic sac) has broken (ruptured). Insert an IV into one of your veins. This may be used to give you fluids and medicines. Monitoring Your health care team may assess your contractions (uterine monitoring) and your baby's heart rate (fetal monitoring). You may need to be monitored: Often, but not continuously (intermittently). All the time or for long periods at a time (continuously). Continuous monitoring may be needed if: You are taking certain medicines, such as medicine to relieve pain or make your contractions stronger. You have pregnancy or labor complications. Monitoring may be done by: Placing a special stethoscope or a handheld monitoring device on your abdomen to check your baby's heartbeat  and to check for contractions. Placing monitors on your abdomen (external monitors) to record your baby's heartbeat and the frequency and length of contractions. Placing monitors inside your uterus through your vagina (internal monitors) to record your baby's heartbeat and the frequency, length, and strength of your contractions. Depending on the type of monitor, it may remain in your uterus or on your baby's head until birth. Telemetry. This is a type of continuous monitoring that can be done with external or internal monitors. Instead of having to stay in bed, you are able to move around. Physical exam Your health care team may perform frequent physical exams. This may include: Checking how and where your baby is positioned in your uterus. Checking your cervix to determine: Whether it is thinning out (effacing). Whether it is opening up (dilating). What happens during labor and delivery? Normal labor and delivery is divided into the following three stages: Stage 1 This is the longest stage of labor. Throughout this stage, you will feel contractions. Contractions generally feel mild, infrequent, and irregular at first. They get stronger, more frequent, and more regular as you move through this stage. You may have contractions about every 2-3 minutes. This stage ends when your cervix is completely dilated to 4 inches (10 cm) and completely effaced. Stage 2 This stage starts once your cervix is completely effaced and dilated and lasts until the delivery of your baby. This is the stage where you will feel an urge to push your baby out of your vagina. You may feel stretching and burning pain, especially when the widest part of your baby's head passes through the vaginal opening (crowning). Once your baby is delivered, the umbilical cord will be clamped and  cut. Timing of cutting the cord will depend on your wishes, your baby's health, and your health care provider's practices. Your baby will be  placed on your bare chest (skin-to-skin contact) in an upright position and covered with a warm blanket. If you are choosing to breastfeed, watch your baby for feeding cues, like rooting or sucking, and help the baby to your breast for his or her first feeding. Stage 3 This stage starts immediately after the birth of your baby and ends after you deliver the placenta. This stage may take anywhere from 5 to 30 minutes. After your baby has been delivered, you will feel contractions as your body expels the placenta. These contractions also help your uterus get smaller and reduce bleeding. What can I expect after labor and delivery? After labor is over, you and your baby will be assessed closely until you are ready to go home. Your health care team will teach you how to care for yourself and your baby. You and your baby may be encouraged to stay in the same room (rooming in) during your hospital stay. This will help promote early bonding and successful breastfeeding. Your uterus will be checked and massaged regularly (fundal massage). You may continue to receive fluids and medicines through an IV. You will have some soreness and pain in your abdomen, vagina, and the area of skin between your vaginal opening and your anus (perineum). If an incision was made near your vagina (episiotomy) or if you had some vaginal tearing during delivery, cold compresses may be placed on your episiotomy or your tear. This helps to reduce pain and swelling. It is normal to have vaginal bleeding after delivery. Wear a sanitary pad for vaginal bleeding and discharge. Summary Vaginal delivery means that you will give birth by pushing your baby out of your birth canal (vagina). Your health care team will monitor you and your baby throughout the stages of labor. After you deliver your baby, your health care team will continue to assess you and your baby to ensure you are both recovering as expected after delivery. This  information is not intended to replace advice given to you by your health care provider. Make sure you discuss any questions you have with your health care provider. Document Revised: 05/14/2020 Document Reviewed: 05/14/2020 Elsevier Patient Education  2022 [employer]. Third Trimester of Pregnancy The third trimester of pregnancy is from week 28 through week 40. This is months 7 through 9. The third trimester is a time when the unborn baby (fetus) is growing rapidly. At the end of the ninth month, the fetus is about 20 inches long and weighs 6-10 pounds. Body changes during your third trimester During the third trimester, your body will continue to go through many changes. The changes vary and generally return to normal after your baby is born. Physical changes Your weight will continue to increase. You can expect to gain 25-35 pounds (11-16 kg) by the end of the pregnancy if you begin pregnancy at a normal weight. If you are underweight, you can expect to gain 28-40 lb (about 13-18 kg), and if you are overweight, you can expect to gain 15-25 lb (about 7-11 kg). You may begin to get stretch marks on your hips, abdomen, and breasts. Your breasts will continue to grow and may hurt. A yellow fluid (colostrum) may leak from your breasts. This is the first milk you are producing for your baby. You may have changes in your hair. These can include thickening of  your hair, rapid growth, and changes in texture. Some people also have hair loss during or after pregnancy, or hair that feels dry or thin. °Your belly button may stick out. °You may notice more swelling in your hands, face, or ankles. °Health changes °You may have heartburn. °You may have constipation. °You may develop hemorrhoids. °You may develop swollen, bulging veins (varicose veins) in your legs. °You may have increased body aches in the pelvis, back, or thighs. This is due to weight gain and increased hormones that are relaxing your joints. °You  may have increased tingling or numbness in your hands, arms, and legs. The skin on your abdomen may also feel numb. °You may feel short of breath because of your expanding uterus. °Other changes °You may urinate more often because the fetus is moving lower into your pelvis and pressing on your bladder. °You may have more problems sleeping. This may be caused by the size of your abdomen, an increased need to urinate, and an increase in your body's metabolism. °You may notice the fetus "dropping," or moving lower in your abdomen (lightening). °You may have increased vaginal discharge. °You may notice that you have pain around your pelvic bone as your uterus distends. °Follow these instructions at home: °Medicines °Follow your health care provider's instructions regarding medicine use. Specific medicines may be either safe or unsafe to take during pregnancy. Do not take any medicines unless approved by your health care provider. °Take a prenatal vitamin that contains at least 600 micrograms (mcg) of folic acid. °Eating and drinking °Eat a healthy diet that includes fresh fruits and vegetables, whole grains, good sources of protein such as meat, eggs, or tofu, and low-fat dairy products. °Avoid raw meat and unpasteurized juice, milk, and cheese. These carry germs that can harm you and your baby. °Eat 4 or 5 small meals rather than 3 large meals a day. °You may need to take these actions to prevent or treat constipation: °Drink enough fluid to keep your urine pale yellow. °Eat foods that are high in fiber, such as beans, whole grains, and fresh fruits and vegetables. °Limit foods that are high in fat and processed sugars, such as fried or sweet foods. °Activity °Exercise only as directed by your health care provider. Most people can continue their usual exercise routine during pregnancy. Try to exercise for 30 minutes at least 5 days a week. Stop exercising if you experience contractions in the uterus. °Stop exercising  if you develop pain or cramping in the lower abdomen or lower back. °Avoid heavy lifting. °Do not exercise if it is very hot or humid or if you are at a high altitude. °If you choose to, you may continue to have sex unless your health care provider tells you not to. °Relieving pain and discomfort °Take frequent breaks and rest with your legs raised (elevated) if you have leg cramps or low back pain. °Take warm sitz baths to soothe any pain or discomfort caused by hemorrhoids. Use hemorrhoid cream if your health care provider approves. °Wear a supportive bra to prevent discomfort from breast tenderness. °If you develop varicose veins: °Wear support hose as told by your health care provider. °Elevate your feet for 15 minutes, 3-4 times a day. °Limit salt in your diet. °Safety °Talk to your health care provider before traveling far distances. °Do not use hot tubs, steam rooms, or saunas. °Wear your seat belt at all times when driving or riding in a car. °Talk with your health care provider   if someone is verbally or physically abusive to you. °Preparing for birth °To prepare for the arrival of your baby: °Take prenatal classes to understand, practice, and ask questions about labor and delivery. °Visit the hospital and tour the maternity area. °Purchase a rear-facing car seat and make sure you know how to install it in your car. °Prepare the baby's room or sleeping area. Make sure to remove all pillows and stuffed animals from the baby's crib to prevent suffocation. °General instructions °Avoid cat litter boxes and soil used by cats. These carry germs that can cause birth defects in the baby. If you have a cat, ask someone to clean the litter box for you. °Do not douche or use tampons. Do not use scented sanitary pads. °Do not use any products that contain nicotine or tobacco, such as cigarettes, e-cigarettes, and chewing tobacco. If you need help quitting, ask your health care provider. °Do not use any herbal remedies,  illegal drugs, or medicines that were not prescribed to you. Chemicals in these products can harm your baby. °Do not drink alcohol. °You will have more frequent prenatal exams during the third trimester. During a routine prenatal visit, your health care provider will do a physical exam, perform tests, and discuss your overall health. Keep all follow-up visits. This is important. °Where to find more information °American Pregnancy Association: americanpregnancy.org °American College of Obstetricians and Gynecologists: acog.org/en/Womens%20Health/Pregnancy °Office on Women's Health: womenshealth.gov/pregnancy °Contact a health care provider if you have: °A fever. °Mild pelvic cramps, pelvic pressure, or nagging pain in your abdominal area or lower back. °Vomiting or diarrhea. °Bad-smelling vaginal discharge or foul-smelling urine. °Pain when you urinate. °A headache that does not go away when you take medicine. °Visual changes or see spots in front of your eyes. °Get help right away if: °Your water breaks. °You have regular contractions less than 5 minutes apart. °You have spotting or bleeding from your vagina. °You have severe abdominal pain. °You have difficulty breathing. °You have chest pain. °You have fainting spells. °You have not felt your baby move for the time period told by your health care provider. °You have new or increased pain, swelling, or redness in an arm or leg. °Summary °The third trimester of pregnancy is from week 28 through week 40 (months 7 through 9). °You may have more problems sleeping. This can be caused by the size of your abdomen, an increased need to urinate, and an increase in your body's metabolism. °You will have more frequent prenatal exams during the third trimester. Keep all follow-up visits. This is important. °This information is not intended to replace advice given to you by your health care provider. Make sure you discuss any questions you have with your health care  provider. °Document Revised: 11/23/2019 Document Reviewed: 09/29/2019 °Elsevier Patient Education © 2022 Elsevier Inc[employer] ° °

## 2021-03-27 NOTE — Progress Notes (Signed)
Subjective:  Donna Diaz is a 20 y.o. G1P0 at [redacted]w[redacted]d being seen today for ongoing prenatal care.  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  Patient reports general discomforts of pregnancy.  Contractions: Not present. Vag. Bleeding: None.  Movement: Present. Denies leaking of fluid.   The following portions of the patient's history were reviewed and updated as appropriate: allergies, current medications, past family history, past medical history, past social history, past surgical history and problem list. Problem list updated.  Objective:   Vitals:   03/27/21 1344  BP: 122/75  Pulse: (!) 106  Weight: 225 lb 11.2 oz (102.4 kg)    Fetal Status: Fetal Heart Rate (bpm): 141   Movement: Present     General:  Alert, oriented and cooperative. Patient is in no acute distress.  Skin: Skin is warm and dry. No rash noted.   Cardiovascular: Normal heart rate noted  Respiratory: Normal respiratory effort, no problems with respiration noted  Abdomen: Soft, gravid, appropriate for gestational age. Pain/Pressure: Present     Pelvic:  Cervical exam deferred        Extremities: Normal range of motion.  Edema: None  Mental Status: Normal mood and affect. Normal behavior. Normal judgment and thought content.   Urinalysis:      Assessment and Plan:  Pregnancy: G1P0 at [redacted]w[redacted]d  1. Encounter for supervision of normal first  pregnancy in third trimester Stable GBS and vaginal cultures next visit  2. Gestational diabetes mellitus (GDM) in third trimester controlled on oral hypoglycemic drug CBG's in goal range Continue with weekly antenatal testing and serial growth scans as per MFM  Preterm labor symptoms and general obstetric precautions including but not limited to vaginal bleeding, contractions, leaking of fluid and fetal movement were reviewed in detail with the patient. Please refer to After Visit Summary for other counseling recommendations.  Return in about 1 week (around 04/03/2021) for OB visit, face to face, MD only.   Hermina Staggers, MD

## 2021-03-27 NOTE — Progress Notes (Signed)
Pt reports fetal movement with some pressure. Reports that fasting BG today was 94.

## 2021-04-01 ENCOUNTER — Ambulatory Visit (INDEPENDENT_AMBULATORY_CARE_PROVIDER_SITE_OTHER): Payer: Medicaid Other

## 2021-04-01 ENCOUNTER — Other Ambulatory Visit: Payer: Self-pay

## 2021-04-01 ENCOUNTER — Ambulatory Visit: Payer: Medicaid Other | Admitting: *Deleted

## 2021-04-01 VITALS — BP 108/64 | HR 113

## 2021-04-01 DIAGNOSIS — O24415 Gestational diabetes mellitus in pregnancy, controlled by oral hypoglycemic drugs: Secondary | ICD-10-CM | POA: Diagnosis not present

## 2021-04-01 NOTE — Progress Notes (Signed)

## 2021-04-04 ENCOUNTER — Ambulatory Visit (INDEPENDENT_AMBULATORY_CARE_PROVIDER_SITE_OTHER): Payer: Medicaid Other | Admitting: Obstetrics and Gynecology

## 2021-04-04 ENCOUNTER — Other Ambulatory Visit (HOSPITAL_COMMUNITY)
Admission: RE | Admit: 2021-04-04 | Discharge: 2021-04-04 | Disposition: A | Discharge status: Home / Self Care | Payer: Medicaid Other | Source: Ambulatory Visit | Attending: Obstetrics and Gynecology | Admitting: Obstetrics and Gynecology

## 2021-04-04 ENCOUNTER — Other Ambulatory Visit: Payer: Self-pay

## 2021-04-04 ENCOUNTER — Encounter: Payer: Self-pay | Admitting: Obstetrics and Gynecology

## 2021-04-04 VITALS — BP 121/74 | HR 111 | Wt 225.0 lb

## 2021-04-04 DIAGNOSIS — Z3403 Encounter for supervision of normal first pregnancy, third trimester: Secondary | ICD-10-CM

## 2021-04-04 DIAGNOSIS — O24415 Gestational diabetes mellitus in pregnancy, controlled by oral hypoglycemic drugs: Secondary | ICD-10-CM

## 2021-04-04 LAB — OB RESULTS CONSOLE GC/CHLAMYDIA: Gonorrhea: NEGATIVE

## 2021-04-04 NOTE — Progress Notes (Signed)
Subjective:  Donna Diaz is a 20 y.o. G1P0 at 109w2d being seen today for ongoing prenatal care.  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  Patient reports general discomforts of pregnancy.  Contractions: Irritability. Vag. Bleeding: None.  Movement: Present. Denies leaking of fluid.   The following portions of the patient's history were reviewed and updated as appropriate: allergies, current medications, past family history, past medical history, past social history, past surgical history and problem list. Problem list updated.  Objective:   Vitals:   04/04/21 1551  BP: 121/74  Pulse: (!) 111  Weight: 225 lb (102.1 kg)    Fetal Status: Fetal Heart Rate (bpm): 150   Movement: Present     General:  Alert, oriented and cooperative. Patient is in no acute distress.  Skin: Skin is warm and dry. No rash noted.   Cardiovascular: Normal heart rate noted  Respiratory: Normal respiratory effort, no problems with respiration noted  Abdomen: Soft, gravid, appropriate for gestational age. Pain/Pressure: Present     Pelvic:  Cervical exam performed        Extremities: Normal range of motion.  Edema: None  Mental Status: Normal mood and affect. Normal behavior. Normal judgment and thought content.   Urinalysis:      Assessment and Plan:  Pregnancy: G1P0 at [redacted]w[redacted]d  1. Encounter for supervision of normal first pregnancy in  third trimester Labor precautions - Strep Gp B NAA - Cervicovaginal ancillary only( )  2. Gestational diabetes mellitus (GDM) in third trimester controlled on oral hypoglycemic drug Forgot CBG log but reports CBG's in goal range. Continue with Metformin Continue with weekly BPP Growth scan ordered - US MFM OB FOLLOW UP; Future  Preterm labor symptoms and general obstetric precautions including but not limited to vaginal bleeding, contractions, leaking of fluid and fetal movement were reviewed in detail with the patient. Please refer to After Visit Summary for other counseling recommendations.  Return in about 1 week (around 04/11/2021) for OB visit, face to face, MD only.   Hermina Staggers, MD

## 2021-04-04 NOTE — Patient Instructions (Signed)

## 2021-04-04 NOTE — Progress Notes (Signed)
ROB [redacted]w[redacted]d Pt wants cervix checked today.  Pt forgot blood sugar log.  CC: None

## 2021-04-05 LAB — CERVICOVAGINAL ANCILLARY ONLY
Chlamydia: NEGATIVE
Comment: NEGATIVE
Comment: NEGATIVE
Comment: NORMAL
Neisseria Gonorrhea: NEGATIVE
Trichomonas: NEGATIVE

## 2021-04-06 LAB — STREP GP B NAA: Strep Gp B NAA: POSITIVE — AB

## 2021-04-08 ENCOUNTER — Ambulatory Visit: Payer: Medicaid Other | Admitting: *Deleted

## 2021-04-08 ENCOUNTER — Other Ambulatory Visit: Payer: Self-pay

## 2021-04-08 ENCOUNTER — Ambulatory Visit (INDEPENDENT_AMBULATORY_CARE_PROVIDER_SITE_OTHER): Payer: Medicaid Other

## 2021-04-08 VITALS — BP 116/65 | HR 107

## 2021-04-08 DIAGNOSIS — O24415 Gestational diabetes mellitus in pregnancy, controlled by oral hypoglycemic drugs: Secondary | ICD-10-CM

## 2021-04-08 NOTE — Progress Notes (Signed)
Pt informed that the ultrasound is considered a limited OB ultrasound and is not intended to be a complete ultrasound exam.  Patient also informed that the ultrasound is not being completed with the intent of assessing for fetal or placental anomalies or any pelvic abnormalities.  Explained that the purpose of today's ultrasound is to assess for presentation, BPP and amniotic fluid volume.  Patient acknowledges the purpose of the exam and the limitations of the study.    Pt reports decreased fetal movement yesterday and today.  She was aware of good FM during BPP and NST.

## 2021-04-09 NOTE — Progress Notes (Signed)
  Patient seen and assessed by nursing staff.  Agree with documentation and plan.  NST:  Baseline: 130 bpm, Variability: Good {> 6 bpm), Accelerations: Reactive, and Decelerations: Absent  

## 2021-04-11 ENCOUNTER — Ambulatory Visit: Payer: Medicaid Other

## 2021-04-11 ENCOUNTER — Encounter: Payer: Self-pay | Admitting: Obstetrics and Gynecology

## 2021-04-11 ENCOUNTER — Ambulatory Visit (INDEPENDENT_AMBULATORY_CARE_PROVIDER_SITE_OTHER): Payer: Medicaid Other | Admitting: Obstetrics and Gynecology

## 2021-04-11 ENCOUNTER — Other Ambulatory Visit: Payer: Self-pay

## 2021-04-11 VITALS — BP 112/68 | HR 108 | Wt 229.0 lb

## 2021-04-11 DIAGNOSIS — O99820 Streptococcus B carrier state complicating pregnancy: Secondary | ICD-10-CM | POA: Insufficient documentation

## 2021-04-11 DIAGNOSIS — Z3403 Encounter for supervision of normal first pregnancy, third trimester: Secondary | ICD-10-CM

## 2021-04-11 DIAGNOSIS — O24415 Gestational diabetes mellitus in pregnancy, controlled by oral hypoglycemic drugs: Secondary | ICD-10-CM

## 2021-04-11 NOTE — Progress Notes (Signed)
Subjective:  Donna Diaz is a 20 y.o. G1P0 at [redacted]w[redacted]d being seen today for ongoing prenatal care.  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  Patient reports general discomforts of pregnancy.  Contractions: Irritability. Vag. Bleeding: None.  Movement: Present. Denies leaking of fluid.   The following portions of the patient's history were reviewed and updated as appropriate: allergies, current medications, past family history, past medical history, past social history, past surgical history and problem list. Problem list updated.  Objective:   Vitals:   04/11/21 1340  BP: 112/68  Pulse: (!) 108  Weight: 229 lb (103.9 kg)    Fetal Status:     Movement: Present     General:  Alert, oriented and cooperative. Patient is in no acute distress.  Skin: Skin is warm and dry. No rash noted.   Cardiovascular: Normal heart rate noted  Respiratory: Normal respiratory effort, no problems with respiration noted  Abdomen: Soft, gravid, appropriate for gestational age. Pain/Pressure: Present     Pelvic:  Cervical exam deferred        Extremities: Normal range of motion.  Edema: Trace  Mental Status: Normal mood and affect. Normal behavior. Normal judgment and thought content.   Urinalysis:      Assessment and Plan:  Pregnancy: G1P0 at [redacted]w[redacted]d  1. Encounter  for supervision of normal first pregnancy in third trimester Stable Labor precautions  2. Gestational diabetes mellitus (GDM) in third trimester controlled on oral hypoglycemic drug CBG's in goal range BPP tomorrow Growth 45 % 03/27/21 IOL scheduled 39-40 weeks  3. GBS (group B Streptococcus carrier), +RV culture, currently pregnant Tx while in labor  Term labor symptoms and general obstetric precautions including but not limited to vaginal bleeding, contractions, leaking of fluid and fetal movement were reviewed in detail with the patient. Please refer to After Visit Summary for other counseling recommendations.  Return in about 1 week (around 04/18/2021) for OB visit, face to face, MD only.   Hermina Staggers, MD

## 2021-04-11 NOTE — Progress Notes (Signed)
+   Fetal movement. No complaints. Fasting blood sugar this morning was 96. Has not checked any others. Does not have logs to review.

## 2021-04-11 NOTE — Patient Instructions (Signed)
Vaginal Delivery ?Vaginal delivery means that you give birth by pushing your baby out of your birth canal (vagina). Your health care team will help you before, during, and after vaginal delivery. ?Birth experiences are unique for every woman and every pregnancy, and birth experiences vary depending on where you choose to give birth. ?What are the risks and benefits? ?Generally, this is safe. However, problems may occur, including: ?Bleeding. ?Infection. ?Damage to other structures such as vaginal tearing. ?Allergic reactions to medicines. ?Despite the risks, benefits of vaginal delivery include less risk of bleeding and infection and a shorter recovery time compared to a Cesarean delivery. Cesarean delivery, or C-section, is the surgical delivery of a baby. ?What happens when I arrive at the birth center or hospital? ?Once you are in labor and have been admitted into the hospital or birth center, your health care team may: ?Review your pregnancy history and any concerns that you have. ?Talk with you about your birth plan and discuss pain control options. ?Check your blood pressure, breathing, and heartbeat. ?Assess your baby's heartbeat. ?Monitor your uterus for contractions. ?Check whether your bag of water (amniotic sac) has broken (ruptured). ?Insert an IV into one of your veins. This may be used to give you fluids and medicines. ?Monitoring ?Your health care team may assess your contractions (uterine monitoring) and your baby's heart rate (fetal monitoring). You may need to be monitored: ?Often, but not continuously (intermittently). ?All the time or for long periods at a time (continuously). Continuous monitoring may be needed if: ?You are taking certain medicines, such as medicine to relieve pain or make your contractions stronger. ?You have pregnancy or labor complications. ?Monitoring may be done by: ?Placing a special stethoscope or a handheld monitoring device on your abdomen to check your baby's heartbeat  and to check for contractions. ?Placing monitors on your abdomen (external monitors) to record your baby's heartbeat and the frequency and length of contractions. ?Placing monitors inside your uterus through your vagina (internal monitors) to record your baby's heartbeat and the frequency, length, and strength of your contractions. Depending on the type of monitor, it may remain in your uterus or on your baby's head until birth. ?Telemetry. This is a type of continuous monitoring that can be done with external or internal monitors. Instead of having to stay in bed, you are able to move around. ?Physical exam ?Your health care team may perform frequent physical exams. This may include: ?Checking how and where your baby is positioned in your uterus. ?Checking your cervix to determine: ?Whether it is thinning out (effacing). ?Whether it is opening up (dilating). ?What happens during labor and delivery? ?Normal labor and delivery is divided into the following three stages: ?Stage 1 ?This is the longest stage of labor. ?Throughout this stage, you will feel contractions. Contractions generally feel mild, infrequent, and irregular at first. They get stronger, more frequent, and more regular as you move through this stage. You may have contractions about every 2-3 minutes. ?This stage ends when your cervix is completely dilated to 4 inches (10 cm) and completely effaced. ?Stage 2 ?This stage starts once your cervix is completely effaced and dilated and lasts until the delivery of your baby. ?This is the stage where you will feel an urge to push your baby out of your vagina. ?You may feel stretching and burning pain, especially when the widest part of your baby's head passes through the vaginal opening (crowning). ?Once your baby is delivered, the umbilical cord will be clamped and   cut. Timing of cutting the cord will depend on your wishes, your baby's health, and your health care provider's practices. ?Your baby will be  placed on your bare chest (skin-to-skin contact) in an upright position and covered with a warm blanket. If you are choosing to breastfeed, watch your baby for feeding cues, like rooting or sucking, and help the baby to your breast for his or her first feeding. ?Stage 3 ?This stage starts immediately after the birth of your baby and ends after you deliver the placenta. ?This stage may take anywhere from 5 to 30 minutes. ?After your baby has been delivered, you will feel contractions as your body expels the placenta. These contractions also help your uterus get smaller and reduce bleeding. ?What can I expect after labor and delivery? ?After labor is over, you and your baby will be assessed closely until you are ready to go home. Your health care team will teach you how to care for yourself and your baby. ?You and your baby may be encouraged to stay in the same room (rooming in) during your hospital stay. This will help promote early bonding and successful breastfeeding. ?Your uterus will be checked and massaged regularly (fundal massage). ?You may continue to receive fluids and medicines through an IV. ?You will have some soreness and pain in your abdomen, vagina, and the area of skin between your vaginal opening and your anus (perineum). ?If an incision was made near your vagina (episiotomy) or if you had some vaginal tearing during delivery, cold compresses may be placed on your episiotomy or your tear. This helps to reduce pain and swelling. ?It is normal to have vaginal bleeding after delivery. Wear a sanitary pad for vaginal bleeding and discharge. ?Summary ?Vaginal delivery means that you will give birth by pushing your baby out of your birth canal (vagina). ?Your health care team will monitor you and your baby throughout the stages of labor. ?After you deliver your baby, your health care team will continue to assess you and your baby to ensure you are both recovering as expected after delivery. ?This  information is not intended to replace advice given to you by your health care provider. Make sure you discuss any questions you have with your health care provider. ?Document Revised: 05/14/2020 Document Reviewed: 05/14/2020 ?Elsevier Patient Education ? 2022 Elsevier Inc. ? ?

## 2021-04-12 ENCOUNTER — Encounter: Payer: Self-pay | Admitting: *Deleted

## 2021-04-12 ENCOUNTER — Other Ambulatory Visit: Payer: Self-pay | Admitting: Obstetrics and Gynecology

## 2021-04-12 ENCOUNTER — Ambulatory Visit: Payer: Medicaid Other | Admitting: *Deleted

## 2021-04-12 ENCOUNTER — Ambulatory Visit: Payer: Medicaid Other | Attending: Obstetrics and Gynecology

## 2021-04-12 VITALS — BP 122/66 | HR 100

## 2021-04-12 DIAGNOSIS — O24415 Gestational diabetes mellitus in pregnancy, controlled by oral hypoglycemic drugs: Secondary | ICD-10-CM

## 2021-04-12 DIAGNOSIS — Z3403 Encounter for supervision of normal first pregnancy, third trimester: Secondary | ICD-10-CM | POA: Insufficient documentation

## 2021-04-15 ENCOUNTER — Telehealth: Payer: Self-pay | Admitting: Obstetrics and Gynecology

## 2021-04-15 ENCOUNTER — Other Ambulatory Visit: Payer: Self-pay | Admitting: *Deleted

## 2021-04-15 ENCOUNTER — Other Ambulatory Visit: Payer: Medicaid Other

## 2021-04-15 DIAGNOSIS — O24415 Gestational diabetes mellitus in pregnancy, controlled by oral hypoglycemic drugs: Secondary | ICD-10-CM

## 2021-04-15 NOTE — Telephone Encounter (Signed)
Patient called to cancel her appointment for today. 

## 2021-04-17 ENCOUNTER — Ambulatory Visit (INDEPENDENT_AMBULATORY_CARE_PROVIDER_SITE_OTHER): Payer: Medicaid Other | Admitting: Obstetrics and Gynecology

## 2021-04-17 ENCOUNTER — Other Ambulatory Visit: Payer: Self-pay

## 2021-04-17 VITALS — BP 133/73 | HR 105 | Wt 231.0 lb

## 2021-04-17 DIAGNOSIS — Z3A38 38 weeks gestation of pregnancy: Secondary | ICD-10-CM | POA: Insufficient documentation

## 2021-04-17 DIAGNOSIS — O99820 Streptococcus B carrier state complicating pregnancy: Secondary | ICD-10-CM

## 2021-04-17 DIAGNOSIS — Z3403 Encounter for supervision of normal first pregnancy, third trimester: Secondary | ICD-10-CM

## 2021-04-17 DIAGNOSIS — O24415 Gestational diabetes mellitus in pregnancy, controlled by oral hypoglycemic drugs: Secondary | ICD-10-CM

## 2021-04-17 NOTE — Progress Notes (Signed)
   PRENATAL VISIT NOTE  Subjective:  Donna Diaz is a 20 y.o. G1P0 at [redacted]w[redacted]d being seen today for ongoing prenatal care.  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  Patient doing well with no acute concerns today. She reports no complaints.  Contractions: Irregular. Vag. Bleeding: None.  Movement: Present. Denies leaking of fluid.   The following portions of the patient's history were reviewed and updated as appropriate: allergies, current medications, past family history, past medical history, past social history, past surgical history and problem list. Problem list updated.  Objective:   Vitals:   04/17/21 1400  BP: 133/73  Pulse: (!) 105  Weight: 231 lb (104.8 kg)    Fetal Status: Fetal Heart Rate (bpm): 160 Fundal Height: 38 cm Movement: Present     General:  Alert, oriented and cooperative. Patient is in no acute distress.  Skin: Skin is warm and dry. No rash noted.   Cardiovascular: Normal heart rate noted  Respiratory: Normal respiratory effort, no problems with respiration noted  Abdomen: Soft, gravid, appropriate for gestational age.  Pain/Pressure: Present     Pelvic: Cervical exam deferred        Extremities: Normal range of motion.     Mental Status:  Normal mood and affect. Normal behavior. Normal  judgment and thought content.   Assessment and Plan:  Pregnancy: G1P0 at [redacted]w[redacted]d  1. Encounter for supervision of normal first pregnancy in third trimester Continue routine care  2. [redacted] weeks gestation of pregnancy   3. Gestational diabetes mellitus (GDM) in third trimester controlled on oral hypoglycemic drug IOL at 39 weeks, FBS: 87-95 PPBS: 115-122, most are in range  4. GBS (group B Streptococcus carrier), +RV culture, currently pregnant Treat in labor  Term labor symptoms and general obstetric precautions including but not limited to vaginal bleeding, contractions, leaking of fluid and fetal movement were reviewed in detail with the patient.  Please refer to After Visit Summary for other counseling recommendations.   Return in about 1 week (around 04/24/2021) for [facility], in person.   Mariel Aloe, MD Faculty Attending Center for [facility]

## 2021-04-17 NOTE — Progress Notes (Signed)
Pt is scheduled for IOB on 10/30.

## 2021-04-22 ENCOUNTER — Encounter: Payer: Self-pay | Admitting: *Deleted

## 2021-04-22 ENCOUNTER — Ambulatory Visit: Payer: Medicaid Other | Admitting: *Deleted

## 2021-04-22 ENCOUNTER — Other Ambulatory Visit: Payer: Self-pay

## 2021-04-22 ENCOUNTER — Ambulatory Visit: Payer: Medicaid Other | Attending: Obstetrics and Gynecology

## 2021-04-22 VITALS — BP 114/60 | HR 94

## 2021-04-22 DIAGNOSIS — Z3403 Encounter for supervision of normal first pregnancy, third trimester: Secondary | ICD-10-CM | POA: Insufficient documentation

## 2021-04-22 DIAGNOSIS — Z3A38 38 weeks gestation of pregnancy: Secondary | ICD-10-CM

## 2021-04-22 DIAGNOSIS — O99213 Obesity complicating pregnancy, third trimester: Secondary | ICD-10-CM | POA: Diagnosis not present

## 2021-04-22 DIAGNOSIS — O24415 Gestational diabetes mellitus in pregnancy, controlled by oral hypoglycemic drugs: Secondary | ICD-10-CM | POA: Diagnosis not present

## 2021-04-22 DIAGNOSIS — E669 Obesity, unspecified: Secondary | ICD-10-CM | POA: Diagnosis not present

## 2021-04-24 ENCOUNTER — Encounter (HOSPITAL_COMMUNITY): Payer: Self-pay

## 2021-04-24 ENCOUNTER — Other Ambulatory Visit: Payer: Self-pay | Admitting: Advanced Practice Midwife

## 2021-04-24 ENCOUNTER — Telehealth (HOSPITAL_COMMUNITY): Payer: Self-pay | Admitting: *Deleted

## 2021-04-24 ENCOUNTER — Encounter (HOSPITAL_COMMUNITY): Payer: Self-pay | Admitting: *Deleted

## 2021-04-24 NOTE — Telephone Encounter (Signed)
Preadmission screen  

## 2021-04-25 ENCOUNTER — Encounter: Payer: Self-pay | Admitting: Obstetrics and Gynecology

## 2021-04-25 ENCOUNTER — Ambulatory Visit (INDEPENDENT_AMBULATORY_CARE_PROVIDER_SITE_OTHER): Payer: Medicaid Other | Admitting: Obstetrics and Gynecology

## 2021-04-25 ENCOUNTER — Other Ambulatory Visit: Payer: Self-pay

## 2021-04-25 VITALS — BP 112/71 | HR 105 | Wt 235.0 lb

## 2021-04-25 DIAGNOSIS — Z3A39 39 weeks gestation of pregnancy: Secondary | ICD-10-CM

## 2021-04-25 DIAGNOSIS — O99820 Streptococcus B carrier state complicating pregnancy: Secondary | ICD-10-CM

## 2021-04-25 DIAGNOSIS — O24415 Gestational diabetes mellitus in pregnancy, controlled by oral hypoglycemic drugs: Secondary | ICD-10-CM

## 2021-04-25 DIAGNOSIS — Z3403 Encounter for supervision of normal first pregnancy, third trimester: Secondary | ICD-10-CM

## 2021-04-25 NOTE — Progress Notes (Signed)
Pt presents for ROB. Did not bring CBG log today  IOL scheduled 04/28/21 Declined cx check today

## 2021-04-25 NOTE — Progress Notes (Signed)
   PRENATAL VISIT NOTE  Subjective:  Donna Diaz is a 20 y.o. G1P0 at [redacted]w[redacted]d being seen today for ongoing prenatal care.  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  Patient reports no complaints.  Contractions: Irritability. Vag. Bleeding: None.  Movement: Present. Denies leaking of fluid.   The following portions of the patient's history were reviewed and updated as appropriate: allergies, current medications, past family history, past medical history, past social history, past surgical history and problem list.   Objective:   Vitals:   04/25/21 1355  BP: 112/71  Pulse: (!) 105  Weight: 235 lb (106.6 kg)    Fetal Status: Fetal Heart Rate (bpm): 135 Fundal Height: 39 cm Movement: Present     General:  Alert, oriented and cooperative. Patient is in no acute distress.  Skin: Skin is warm and dry. No rash noted.   Cardiovascular: Normal heart rate noted  Respiratory: Normal respiratory effort, no problems with respiration noted  Abdomen: Soft, gravid, appropriate for gestational age.  Pain/Pressure: Present     Pelvic: Cervical exam deferred        Extremities: Normal range of motion.  Edema: None  Mental Status: Normal mood and affect. Normal behavior. Normal judgment and thought content.   Assessment and Plan:  Pregnancy: G1P0 at [redacted]w[redacted]d 1.  Encounter for supervision of normal first pregnancy in third trimester Patient is doing well without complaints  2. Gestational diabetes mellitus (GDM) in third trimester controlled on oral hypoglycemic drug Patient did not bring log but reports highest fasting 97 and pp 115-118 Continue metformin 10/24 EFW 86%tile with BPP 8/8   3. GBS (group B Streptococcus carrier), +RV culture, currently pregnant Prophylaxis in labor  4. [redacted] weeks gestation of pregnancy   Term labor symptoms and general obstetric precautions including but not limited to vaginal bleeding, contractions, leaking of fluid and fetal movement were reviewed in detail with the patient. Please refer to After Visit Summary for other counseling recommendations.   Return in about 6 weeks (around 06/06/2021) for in person, postpartum, 2 hr glucola.  Future Appointments  Date Time Provider Department Center  04/28/2021  7:45 AM [facility] SCHED ROOM [facility] None  05/02/2021  1:30 PM Jade Burright, Gigi Gin, MD [facility] None    Catalina Antigua, MD

## 2021-04-26 ENCOUNTER — Other Ambulatory Visit: Payer: Self-pay | Admitting: Obstetrics & Gynecology

## 2021-04-26 LAB — SARS CORONAVIRUS 2 (TAT 6-24 HRS): SARS Coronavirus 2: NEGATIVE

## 2021-04-28 ENCOUNTER — Other Ambulatory Visit: Payer: Self-pay

## 2021-04-28 ENCOUNTER — Inpatient Hospital Stay (HOSPITAL_COMMUNITY): Payer: Medicaid Other

## 2021-04-28 ENCOUNTER — Inpatient Hospital Stay (HOSPITAL_COMMUNITY)
Admission: AD | Admit: 2021-04-28 | Discharge: 2021-05-02 | DRG: 805 | Disposition: A | Discharge status: Home / Self Care | Payer: Medicaid Other | Attending: Family Medicine | Admitting: Family Medicine

## 2021-04-28 ENCOUNTER — Encounter (HOSPITAL_COMMUNITY): Payer: Self-pay | Admitting: Obstetrics and Gynecology

## 2021-04-28 DIAGNOSIS — O99820 Streptococcus B carrier state complicating pregnancy: Secondary | ICD-10-CM

## 2021-04-28 DIAGNOSIS — Z3400 Encounter for supervision of normal first pregnancy, unspecified trimester: Secondary | ICD-10-CM

## 2021-04-28 DIAGNOSIS — O411230 Chorioamnionitis, third trimester, not applicable or unspecified: Secondary | ICD-10-CM | POA: Diagnosis present

## 2021-04-28 DIAGNOSIS — Z23 Encounter for immunization: Secondary | ICD-10-CM

## 2021-04-28 DIAGNOSIS — O9081 Anemia of the puerperium: Secondary | ICD-10-CM | POA: Diagnosis not present

## 2021-04-28 DIAGNOSIS — O99019 Anemia complicating pregnancy, unspecified trimester: Secondary | ICD-10-CM | POA: Diagnosis present

## 2021-04-28 DIAGNOSIS — O24113 Pre-existing diabetes mellitus, type 2, in pregnancy, third trimester: Secondary | ICD-10-CM | POA: Diagnosis not present

## 2021-04-28 DIAGNOSIS — Z3A39 39 weeks gestation of pregnancy: Secondary | ICD-10-CM | POA: Diagnosis not present

## 2021-04-28 DIAGNOSIS — Z3403 Encounter for supervision of normal first pregnancy, third trimester: Secondary | ICD-10-CM

## 2021-04-28 DIAGNOSIS — Z3A40 40 weeks gestation of pregnancy: Secondary | ICD-10-CM | POA: Diagnosis not present

## 2021-04-28 DIAGNOSIS — F322 Major depressive disorder, single episode, severe without psychotic features: Secondary | ICD-10-CM | POA: Diagnosis present

## 2021-04-28 DIAGNOSIS — O24425 Gestational diabetes mellitus in childbirth, controlled by oral hypoglycemic drugs: Principal | ICD-10-CM | POA: Diagnosis present

## 2021-04-28 DIAGNOSIS — D62 Acute posthemorrhagic anemia: Secondary | ICD-10-CM | POA: Diagnosis not present

## 2021-04-28 DIAGNOSIS — O99824 Streptococcus B carrier state complicating childbirth: Secondary | ICD-10-CM | POA: Diagnosis present

## 2021-04-28 DIAGNOSIS — O43121 Velamentous insertion of umbilical cord, first trimester: Secondary | ICD-10-CM | POA: Diagnosis present

## 2021-04-28 DIAGNOSIS — O24419 Gestational diabetes mellitus in pregnancy, unspecified control: Secondary | ICD-10-CM | POA: Diagnosis present

## 2021-04-28 LAB — CBC
HCT: 32.5 % — ABNORMAL LOW (ref 36.0–46.0)
Hemoglobin: 9.5 g/dL — ABNORMAL LOW (ref 12.0–15.0)
MCH: 20 pg — ABNORMAL LOW (ref 26.0–34.0)
MCHC: 29.2 g/dL — ABNORMAL LOW (ref 30.0–36.0)
MCV: 68.3 fL — ABNORMAL LOW (ref 80.0–100.0)
Platelets: 363 10*3/uL (ref 150–400)
RBC: 4.76 MIL/uL (ref 3.87–5.11)
RDW: 18.2 % — ABNORMAL HIGH (ref 11.5–15.5)
WBC: 7.7 10*3/uL (ref 4.0–10.5)
nRBC: 0.3 % — ABNORMAL HIGH (ref 0.0–0.2)

## 2021-04-28 LAB — TYPE AND SCREEN
ABO/RH(D): A POS
Antibody Screen: NEGATIVE

## 2021-04-28 LAB — GLUCOSE, CAPILLARY
Glucose-Capillary: 70 mg/dL (ref 70–99)
Glucose-Capillary: 115 mg/dL — ABNORMAL HIGH (ref 70–99)
Glucose-Capillary: 75 mg/dL (ref 70–99)

## 2021-04-28 MED ORDER — ACETAMINOPHEN 325 MG PO TABS
650.0000 mg | ORAL_TABLET | ORAL | Status: DC | PRN
  Administered 2021-04-30: 650 mg via ORAL
  Filled 2021-04-28: qty 2

## 2021-04-28 MED ORDER — OXYTOCIN BOLUS FROM INFUSION
333.0000 mL | Freq: Once | INTRAVENOUS | Status: AC
  Administered 2021-05-01: 333 mL via INTRAVENOUS

## 2021-04-28 MED ORDER — SOD CITRATE-CITRIC ACID 500-334 MG/5ML PO SOLN: 30.0000 mL | ORAL | Status: DC | PRN

## 2021-04-28 MED ORDER — PENICILLIN G POT IN DEXTROSE 60000 UNIT/ML IV SOLN
3.0000 10*6.[IU] | INTRAVENOUS | Status: DC
  Administered 2021-04-28 – 2021-04-30 (×13): 3 10*6.[IU] via INTRAVENOUS
  Filled 2021-04-28 (×14): qty 50

## 2021-04-28 MED ORDER — SODIUM CHLORIDE 0.9 % IV SOLN: 2.0000 g | Freq: Once | INTRAVENOUS | Status: DC

## 2021-04-28 MED ORDER — MISOPROSTOL 25 MCG QUARTER TABLET
25.0000 ug | ORAL_TABLET | ORAL | Status: DC | PRN
  Administered 2021-04-28: 25 ug via VAGINAL
  Filled 2021-04-28: qty 1

## 2021-04-28 MED ORDER — SODIUM CHLORIDE 0.9 % IV SOLN
5.0000 10*6.[IU] | Freq: Once | INTRAVENOUS | Status: AC
  Administered 2021-04-28: 5 10*6.[IU] via INTRAVENOUS
  Filled 2021-04-28: qty 5

## 2021-04-28 MED ORDER — MISOPROSTOL 50MCG HALF TABLET
50.0000 ug | ORAL_TABLET | ORAL | Status: DC | PRN
  Administered 2021-04-28 – 2021-04-29 (×6): 50 ug via ORAL
  Filled 2021-04-28 (×6): qty 1

## 2021-04-28 MED ORDER — FENTANYL CITRATE (PF) 100 MCG/2ML IJ SOLN
50.0000 ug | INTRAMUSCULAR | Status: DC | PRN
  Administered 2021-04-29 (×2): 100 ug via INTRAVENOUS
  Filled 2021-04-28 (×2): qty 2

## 2021-04-28 MED ORDER — OXYCODONE-ACETAMINOPHEN 5-325 MG PO TABS: 2.0000 | ORAL_TABLET | ORAL | Status: DC | PRN

## 2021-04-28 MED ORDER — OXYTOCIN-SODIUM CHLORIDE 30-0.9 UT/500ML-% IV SOLN
2.5000 [IU]/h | INTRAVENOUS | Status: DC
  Administered 2021-05-01: 2.5 [IU]/h via INTRAVENOUS
  Filled 2021-04-28: qty 500

## 2021-04-28 MED ORDER — LACTATED RINGERS IV SOLN
500.0000 mL | INTRAVENOUS | Status: DC | PRN
  Administered 2021-04-29: 500 mL via INTRAVENOUS

## 2021-04-28 MED ORDER — ONDANSETRON HCL 4 MG/2ML IJ SOLN
4.0000 mg | Freq: Four times a day (QID) | INTRAMUSCULAR | Status: DC | PRN
  Administered 2021-04-30: 4 mg via INTRAVENOUS
  Filled 2021-04-28: qty 2

## 2021-04-28 MED ORDER — LIDOCAINE HCL (PF) 1 % IJ SOLN
30.0000 mL | INTRAMUSCULAR | Status: AC | PRN
  Administered 2021-05-01: 30 mL via SUBCUTANEOUS
  Filled 2021-04-28: qty 30

## 2021-04-28 MED ORDER — LACTATED RINGERS IV SOLN
INTRAVENOUS | Status: DC
  Administered 2021-04-29: 1000 mL via INTRAVENOUS

## 2021-04-28 MED ORDER — SODIUM CHLORIDE 0.9 % IV SOLN: 1.0000 g | INTRAVENOUS | Status: DC

## 2021-04-28 MED ORDER — OXYCODONE-ACETAMINOPHEN 5-325 MG PO TABS: 1.0000 | ORAL_TABLET | ORAL | Status: DC | PRN

## 2021-04-28 MED ORDER — TERBUTALINE SULFATE 1 MG/ML IJ SOLN
0.2500 mg | Freq: Once | INTRAMUSCULAR | Status: DC | PRN
Start: 2021-04-28 — End: 2021-05-01

## 2021-04-28 NOTE — Progress Notes (Signed)
Roselina Burgueno is a 20 y.o. G1P0 at [redacted]w[redacted]d  admitted for induction of labor due to A2GDM   Subjective: Reports starting to feel some contractions. Able to talk and breathe through them  Objective: BP (!) 111/52   Pulse 82   Temp 98.8 F (37.1 C) (Oral)   Resp 18   Ht 5\' 2"  (1.575 m)   Wt 105.7 kg   BMI 42.62 kg/m  No intake/output data recorded. No intake/output data recorded.  FHT:  FHR: 135 bpm, variability: moderate,  accelerations:  Present,  decelerations:  Absent UC:   irregular, every 3-5 minutes SVE:   Dilation: Fingertip Effacement (%): Thick Station: -3 Exam by:: [email or web address]: Lab Results  Component Value Date   WBC 7.7 04/28/2021   HGB 9.5 (L) 04/28/2021   HCT 32.5 (L) 04/28/2021   MCV 68.3 (L) 04/28/2021   PLT 363 04/28/2021    Assessment / Plan: Induction of labor due to A2GDM  Labor:  s//p Cytotecx1. Cervix external 0.5 internal closed. Will do cytotec dose#2 and assess for foley balloon at next check Fetal Wellbeing:  Category I I/D:   GBS pos>PCN    04/30/2021 04/28/2021, 6:23 PM

## 2021-04-28 NOTE — Progress Notes (Signed)
Labor Progress Note Donna Diaz is a 20 y.o. G1P0 at [redacted]w[redacted]d admitted for IOL due to A2GDM  S: Sitting in bed watching TV comfortably. States she can feel some contractions about every 10 minutes or so, rates them a 3/10.   O:  BP (!) 111/52   Pulse 82   Temp 98.8 F (37.1 C) (Oral)   Resp 18   Ht 5\' 2"  (1.575 m)   Wt 105.7 kg   BMI 42.62 kg/m  EFM: baseline 135 / moderate variability / accelerations present, no decelerations  CVE: Dilation: Fingertip Effacement (%): Thick Station: -3 Presentation: Vertex Exam by:: [email or web address]   A&P: 20 y.o. G1P0 [redacted]w[redacted]d admitted for IOL due to A2GDM #Labor: Cervical exam with minor changes, fingertip but internal open. Will dose cytotec #3 and evaluate for foley at next check. #Pain: No significant pain currently #FWB: Category I strip #GBS positive. Penicillin ordered. #GDMA2: on metformin 500 mg BID at home. CBG 75 most recently, within goal. Q4hr CBG. #Anemia of pregnancy, Hgb 9.5: On PO iron. Will repeat CBC if needed.  [redacted]w[redacted]d, MD 11:35 PM

## 2021-04-28 NOTE — H&P (Signed)
OBSTETRIC ADMISSION HISTORY AND PHYSICAL  Donna Diaz is a 20 y.o. female G1P0 with IUP at 45w5dby LMP presenting for IOL for A2GDM (on Metformin). She reports +FMs, No LOF, no VB, no blurry vision, headaches or peripheral edema, and RUQ pain.  She plans on breast and bottle feeding. She is still deciding on her contraception method but is considering Nexplanon or Depo. [facility]--->  Estimated Date of Delivery: 04/30/21  Sono:   @[redacted]w[redacted]d , CWD, normal anatomy, cephalic presentation, posterior placental lie, 3872g, 86ile% EFW, AC 99%ile   Prenatal History/Complications:  AA0TMA- on Metformin 5037mBID  Past Medical History: Past Medical History:  Diagnosis Date   Anxiety    Asthma    Depression    Eczema    GDM (gestational diabetes mellitus) 02/2021   Mood disorder (HCC)    POTS (postural orthostatic tachycardia syndrome)    Social anxiety disorder     Past Surgical History: Past Surgical History:  Procedure Laterality Date   NO PAST SURGERIES      Obstetrical History: OB History     Gravida  1   Para      Term      Preterm      AB      Living         SAB      IAB      Ectopic      Multiple      Live Births  0           Social History Social History   Socioeconomic History   Marital status: Married    Spouse name: Not on file   Number of children: Not on file   Years of education: Not on file   Highest education level: Not on file  Occupational History   Not on file  Tobacco Use   Smoking status: Never    Passive exposure: Yes   Smokeless tobacco: Never  Vaping Use   Vaping Use: Never used  Substance and Sexual Activity   Alcohol use: No   Drug use: No   Sexual activity: Never  Other Topics Concern   Not on file  Social History Narrative   [employer] in the 11th grade [employer] [place] [employer] [place] [employer]. She lives with her parent and one sister. She enjoys drawing,  reading, and watch documentaries about animals.    Social Determinants of Health   Financial Resource Strain: Not on file  Food Insecurity: No Food Insecurity   Worried About RuCharity fundraisern the Last Year: Never true   Ran Out of Food in the Last Year: Never true  Transportation Needs: No Transportation Needs   Lack of Transportation (Medical): No   Lack of Transportation (Non-Medical): No  Physical Activity: Not on file  Stress: Not on file  Social Connections: Not on file    Family History: Family History  Problem Relation Age of Onset   Migraines Mother    Seizures Mother        past   Depression Mother    Anxiety disorder Mother    Depression Father    Anxiety disorder Father    Depression Sister    Anxiety disorder Sister    Migraines Maternal Aunt    Depression Maternal Grandmother    Anxiety disorder Maternal Grandmother    Multiple sclerosis Sister    Bipolar disorder  Neg Hx    Schizophrenia Neg Hx    ADD / ADHD Neg Hx    Autism Neg Hx     Allergies: No Known Allergies  Medications Prior to Admission  Medication Sig Dispense Refill Last Dose   Accu-Chek Softclix Lancets lancets 1 each by Other route 4 (four) times daily. 100 each 12    acetaminophen (TYLENOL) 500 MG tablet Take 1,000 mg by mouth every 6 (six) hours as needed.      aspirin EC 81 MG tablet Take 1 tablet (81 mg total) by mouth daily. Take after 12 weeks for prevention of preeclampsia later in pregnancy 300 tablet 2    Blood Glucose Monitoring Suppl (ACCU-CHEK GUIDE) w/Device KIT 1 Device by Does not apply route 4 (four) times daily. 1 kit 0    glucose blood (ACCU-CHEK GUIDE) test strip Use to check blood sugars four times a day was instructed 50 each 12    iron polysaccharides (NIFEREX) 150 MG capsule Take 1 capsule (150 mg total) by mouth every other day. 15 capsule 11    metFORMIN (GLUCOPHAGE) 500 MG tablet Take 1 tablet (500 mg total) by mouth 2 (two) times daily with a meal. 60 tablet  5    Misc. Devices (GOJJI WEIGHT SCALE) MISC 1 Device by Does not apply route once a week. 1 each 0    Prenatal Vit-Fe Fumarate-FA (PRENATAL VITAMIN PO) Take by mouth.        Review of Systems   All systems reviewed and negative except as stated in HPI  Blood pressure (!) 111/52, pulse 82, temperature 98.8 F (37.1 C), temperature source Oral, resp. rate 18, height 5' 2"  (1.575 m), weight 105.7 kg. General appearance: alert Lungs: clear to auscultation bilaterally Heart: regular rate and rhythm Abdomen: soft, non-tender; bowel sounds normal Extremities: Homans sign is negative, no sign of DVT Presentation: cephalic Fetal monitoringBaseline: 130 bpm, Variability: Good {> 6 bpm), Accelerations: Reactive, and Decelerations: Absent Uterine activity irregular Dilation: Closed Effacement (%): 50 Station: -3 Exam by:: J.Cox, RN   Prenatal labs: ABO, Rh: --/--/A POS (10/30 1250) Antibody: NEG (10/30 1250) Rubella: 3.09 (08/03 1001) RPR: Non Reactive (08/03 1001)  HBsAg: Negative (08/03 1001)  HIV: Non Reactive (08/03 1001)  GBS: Positive/-- (10/06 1615)  1 hr Glucola abnormal Genetic screening  normal panorama Anatomy US normal anatomy  Prenatal Transfer Tool  Maternal Diabetes: Yes:  Diabetes Type:  Insulin/Medication controlled Genetic Screening: Normal Maternal Ultrasounds/Referrals: Normal Fetal Ultrasounds or other Referrals:  None Maternal Substance Abuse:  No Significant Maternal Medications:  Meds include: Other: Metformin Significant Maternal Lab Results: Group B Strep positive  Results for orders placed or performed during the hospital encounter of 04/28/21 (from the past 24 hour(s))  Type and screen   Collection Time: 04/28/21 12:50 PM  Result Value Ref Range   ABO/RH(D) A POS    Antibody Screen NEG    Sample Expiration      05/01/2021,2359 Performed at [facility], [place] [address] [place],  [address]   CBC   Collection Time: 04/28/21 12:56  PM  Result Value Ref Range   WBC 7.7 4.0 - 10.5 K/uL   RBC 4.76 3.87 - 5.11 MIL/uL   Hemoglobin 9.5 (L) 12.0 - 15.0 g/dL   HCT 32.5 (L) 36.0 - 46.0 %   MCV 68.3 (L) 80.0 - 100.0 fL   MCH 20.0 (L) 26.0 - 34.0 pg   MCHC 29.2 (L) 30.0 - 36.0 g/dL   RDW  18.2 (H) 11.5 - 15.5 %   Platelets 363 150 - 400 K/uL   nRBC 0.3 (H) 0.0 - 0.2 %  Glucose, capillary   Collection Time: 04/28/21  1:51 PM  Result Value Ref Range   Glucose-Capillary 70 70 - 99 mg/dL    Patient Active Problem List   Diagnosis Date Noted   Gestational diabetes mellitus 04/28/2021   GBS (group B Streptococcus carrier), +RV culture, currently pregnant 04/11/2021   Gestational diabetes mellitus (GDM) in third trimester controlled on oral hypoglycemic drug 01/30/2021   Supervision of normal first pregnancy 01/29/2021   Anxiety state 10/15/2017   Circadian rhythm sleep disorder 10/15/2017   MDD (major depressive disorder), severe ([place]) 10/01/2017   Frequent headaches 08/04/2017   Social anxiety disorder 08/04/2017   Vasovagal syncope 07/13/2017   Intrinsic eczema 06/01/2017   Abnormal hearing screen 04/17/2017   Psychosocial stressors 06/09/2016   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 G1P0 at 38w5dhere for IOL for A2GDM (on Metformin)  #Labor: Cervix closed at admission. Cytotec 276m given vaginally. Will recheck at 4 hours and assess for appropriateness of foley bulb placement and additional Cytotec dose. #Pain: Planning Epidural #FWB: Cat I  #ID:  GBS pos>PCN #MOF: Both #MOC: Still deciding #Circ:  yes  #A2GDM Early A1C abnormal at 6.7, per chart failed early GTT ([place] [facility] not on epic). On Metformin, took last dose this morning.  - q4hr  CBG  #Anemia of pregnancy HgB 9.9>9.5. On PO Iron.  AnRenard MatterMD, MPH OB Fellow, Faculty Practice

## 2021-04-29 ENCOUNTER — Inpatient Hospital Stay (HOSPITAL_COMMUNITY): Payer: Medicaid Other | Admitting: Anesthesiology

## 2021-04-29 LAB — RPR: RPR Ser Ql: NONREACTIVE

## 2021-04-29 LAB — GLUCOSE, CAPILLARY
Glucose-Capillary: 81 mg/dL (ref 70–99)
Glucose-Capillary: 86 mg/dL (ref 70–99)
Glucose-Capillary: 97 mg/dL (ref 70–99)
Glucose-Capillary: 79 mg/dL (ref 70–99)
Glucose-Capillary: 104 mg/dL — ABNORMAL HIGH (ref 70–99)
Glucose-Capillary: 107 mg/dL — ABNORMAL HIGH (ref 70–99)

## 2021-04-29 MED ORDER — FENTANYL-BUPIVACAINE-NACL 0.5-0.125-0.9 MG/250ML-% EP SOLN
12.0000 mL/h | EPIDURAL | Status: DC | PRN
  Administered 2021-04-29 – 2021-04-30 (×2): 12 mL/h via EPIDURAL
  Filled 2021-04-29: qty 250

## 2021-04-29 MED ORDER — DIPHENHYDRAMINE HCL 50 MG/ML IJ SOLN: 12.5000 mg | INTRAMUSCULAR | Status: DC | PRN

## 2021-04-29 MED ORDER — PHENYLEPHRINE 40 MCG/ML (10ML) SYRINGE FOR IV PUSH (FOR BLOOD PRESSURE SUPPORT): 80.0000 ug | PREFILLED_SYRINGE | INTRAVENOUS | Status: DC | PRN

## 2021-04-29 MED ORDER — FENTANYL-BUPIVACAINE-NACL 0.5-0.125-0.9 MG/250ML-% EP SOLN
EPIDURAL | Status: AC
  Filled 2021-04-29: qty 250

## 2021-04-29 MED ORDER — PHENYLEPHRINE 40 MCG/ML (10ML) SYRINGE FOR IV PUSH (FOR BLOOD PRESSURE SUPPORT)
80.0000 ug | PREFILLED_SYRINGE | INTRAVENOUS | Status: DC | PRN
  Filled 2021-04-29: qty 10

## 2021-04-29 MED ORDER — EPHEDRINE 5 MG/ML INJ: 10.0000 mg | INTRAVENOUS | Status: DC | PRN

## 2021-04-29 MED ORDER — OXYTOCIN-SODIUM CHLORIDE 30-0.9 UT/500ML-% IV SOLN: 1.0000 m[IU]/min | INTRAVENOUS | Status: DC

## 2021-04-29 MED ORDER — LACTATED RINGERS IV SOLN
500.0000 mL | Freq: Once | INTRAVENOUS | Status: AC
  Administered 2021-04-29: 500 mL via INTRAVENOUS

## 2021-04-29 MED ORDER — TERBUTALINE SULFATE 1 MG/ML IJ SOLN: 0.2500 mg | Freq: Once | INTRAMUSCULAR | Status: DC | PRN

## 2021-04-29 MED ORDER — LIDOCAINE HCL (PF) 1 % IJ SOLN
INTRAMUSCULAR | Status: DC | PRN
  Administered 2021-04-29: 5 mL via EPIDURAL
  Administered 2021-04-29: 3 mL via EPIDURAL

## 2021-04-29 MED ORDER — OXYTOCIN-SODIUM CHLORIDE 30-0.9 UT/500ML-% IV SOLN
1.0000 m[IU]/min | INTRAVENOUS | Status: DC
  Administered 2021-04-29: 2 m[IU]/min via INTRAVENOUS
  Administered 2021-04-30: 10 m[IU]/min via INTRAVENOUS
  Filled 2021-04-29: qty 500

## 2021-04-29 NOTE — Progress Notes (Signed)
Labor Progress Note Donna Diaz is a 20 y.o. G1P0 at [redacted]w[redacted]d presented for IOL A2GDM.   S: Doing well no concerns.   O:  BP (!) 119/51   Pulse 97   Temp 98 F (36.7 C)   Resp 16   Ht 5\' 2"  (1.575 m)   Wt 105.7 kg   BMI 42.62 kg/m  EFM: 135 bpm/15x15/no decels, ctx irritability  CVE: Dilation: 1 Effacement (%): Thick Cervical Position: Posterior Station: -2 Presentation: Vertex Exam by:: [email or web address]   A&P: 20 y.o. G1P0 [redacted]w[redacted]d IOL 2/2 A2GDM.  #Labor: FB placed. Cyto x5 given. Plan for pitocin when appropriate #Pain: maternally supported, epidural planning #FWB: Cat I #GBS positive   A2GDM CBGs appropriate.  -CBGs q4h  Arella Blinder Autry-Lott, DO 9:30 AM

## 2021-04-29 NOTE — Anesthesia Procedure Notes (Signed)
Epidural Patient location during procedure: OB Start time: 04/29/2021 9:05 PM End time: 04/29/2021 9:13 PM  Staffing Anesthesiologist: Atilano Median, DO Performed: anesthesiologist   Preanesthetic Checklist Completed: patient identified, IV checked, site marked, risks and benefits discussed, surgical consent, monitors and equipment checked, pre-op evaluation and timeout performed  Epidural Patient position: sitting Prep: ChloraPrep Patient monitoring: heart rate, continuous pulse ox and blood pressure Approach: midline Location: L3-L4 Injection technique: LOR saline  Needle:  Needle type: Tuohy  Needle gauge: 17 G Needle length: 9 cm Needle insertion depth: 8 cm Catheter type: closed end flexible Catheter size: 20 Guage Catheter at skin depth: 13 cm Test dose: negative and 1.5% lidocaine  Assessment Events: blood not aspirated, injection not painful, no injection resistance and no paresthesia  Additional Notes Patient identified. Risks/Benefits/Options discussed with patient including but not limited to bleeding, infection, nerve damage, paralysis, failed block, incomplete pain control, headache, blood pressure changes, nausea, vomiting, reactions to medications, itching and postpartum back pain. Confirmed with bedside nurse the patient's most recent platelet count. Confirmed with patient that they are not currently taking any anticoagulation, have any bleeding history or any family history of bleeding disorders. Patient expressed understanding and wished to proceed. All questions were answered. Sterile technique was used throughout the entire procedure. Please see nursing notes for vital signs. Test dose was given through epidural catheter and negative prior to continuing to dose epidural or start infusion. Warning signs of high block given to the patient including shortness of breath, tingling/numbness in hands, complete motor block, or any concerning symptoms with  instructions to call for help. Patient was given instructions on fall risk and not to get out of bed. All questions and concerns addressed with instructions to call with any issues or inadequate analgesia.    Reason for block:procedure for pain

## 2021-04-29 NOTE — Progress Notes (Signed)
Patient Vitals for the past 4 hrs:  BP Pulse Resp  04/29/21 1735 (!) 122/58 89 15  04/29/21 1616 (!) 147/69 (!) 108 16   Cooks out around 3:30.  Cx 4-ish/thick/-3  FHR 135, moderate variability, + accels.  Has had periods of minimal variability, occ variable decel.  EFM now Cat 1.  Will continue cytotec until cx thins, then start pitocin.

## 2021-04-29 NOTE — Progress Notes (Signed)
Patient Vitals for the past 4 hrs:  BP Temp Temp src Pulse Resp  04/29/21 1147 (!) 112/53 98.4 F (36.9 C) Oral 86 16  04/29/21 1100 (!) 112/53 -- -- -- --  04/29/21 1034 120/69 -- -- 97 --   Blood sugars 86/97/ FHR Cat 1.  Mild and irregular ctx.  Cooks still in  Continue cytotec until catheter comes out.

## 2021-04-29 NOTE — Progress Notes (Signed)
Pt uncomfortable without epidural. Feeling regular contractions.   BP 130/71 CBG 104  FHT 140, mod var + accels, no decels Toco Q2-4 min CE 5/70/-2, clear fluid (SROM)  Plan:  Continue present management. She is making excellent change. If no cervical change after about 4 hours (given miso at 1735), we will add in Pitocin. Reviewed plan of care with her nurse. All questions answered for pt and family. FHT category 1.

## 2021-04-29 NOTE — Anesthesia Preprocedure Evaluation (Signed)
Anesthesia Evaluation  Patient identified by MRN, date of birth, ID band Patient awake    Reviewed: Allergy & Precautions, NPO status , Patient's Chart, lab work & pertinent test results  Airway Mallampati: II  TM Distance: >3 FB Neck ROM: Full    Dental no notable dental hx.    Pulmonary asthma ,    Pulmonary exam normal        Cardiovascular negative cardio ROS   Rhythm:Regular Rate:Normal     Neuro/Psych  Headaches, Anxiety Depression    GI/Hepatic negative GI ROS, Neg liver ROS,   Endo/Other  diabetes, Type 2, Oral Hypoglycemic Agents  Renal/GU negative Renal ROS  negative genitourinary   Musculoskeletal negative musculoskeletal ROS (+)   Abdominal (+)  Abdomen: soft.    Peds  Hematology negative hematology ROS (+)   Anesthesia Other Findings   Reproductive/Obstetrics (+) Pregnancy                             Anesthesia Physical Anesthesia Plan  ASA: 2  Anesthesia Plan: Epidural   Post-op Pain Management:    Induction:   PONV Risk Score and Plan: 2 and Treatment may vary due to age or medical condition  Airway Management Planned: Natural Airway  Additional Equipment: None  Intra-op Plan:   Post-operative Plan:   Informed Consent: I have reviewed the patients History and Physical, chart, labs and discussed the procedure including the risks, benefits and alternatives for the proposed anesthesia with the patient or authorized representative who has indicated his/her understanding and acceptance.     Dental advisory given  Plan Discussed with:   Anesthesia Plan Comments: (Lab Results      Component                Value               Date                      WBC                      7.7                 04/28/2021                HGB                      9.5 (L)             04/28/2021                HCT                      32.5 (L)            04/28/2021                 MCV                      68.3 (L)            04/28/2021                PLT                      363                 04/28/2021  Lab Results      Component                Value               Date                      NA                       134 (L)             12/31/2020                K                        3.9                 12/31/2020                CO2                      22                  12/31/2020                GLUCOSE                  91                  12/31/2020                BUN                      <5 (L)              12/31/2020                CREATININE               0.50                12/31/2020                CALCIUM                  9.1                 12/31/2020                GFRNONAA                 >60                 12/31/2020          )        Anesthesia Quick Evaluation

## 2021-04-30 LAB — GLUCOSE, CAPILLARY
Glucose-Capillary: 89 mg/dL (ref 70–99)
Glucose-Capillary: 132 mg/dL — ABNORMAL HIGH (ref 70–99)
Glucose-Capillary: 121 mg/dL — ABNORMAL HIGH (ref 70–99)
Glucose-Capillary: 106 mg/dL — ABNORMAL HIGH (ref 70–99)
Glucose-Capillary: 87 mg/dL (ref 70–99)
Glucose-Capillary: 71 mg/dL (ref 70–99)

## 2021-04-30 MED ORDER — ACETAMINOPHEN 10 MG/ML IV SOLN
1000.0000 mg | Freq: Once | INTRAVENOUS | Status: DC
  Filled 2021-04-30: qty 100

## 2021-04-30 MED ORDER — GENTAMICIN SULFATE 40 MG/ML IJ SOLN
5.0000 mg/kg | INTRAVENOUS | Status: DC
  Administered 2021-05-01: 360 mg via INTRAVENOUS
  Filled 2021-04-30: qty 9

## 2021-04-30 MED ORDER — SODIUM CHLORIDE 0.9 % IV SOLN
2.0000 g | Freq: Four times a day (QID) | INTRAVENOUS | Status: DC
  Administered 2021-04-30: 2 g via INTRAVENOUS
  Filled 2021-04-30: qty 2000

## 2021-04-30 NOTE — Progress Notes (Signed)
Patient ID: Donna Diaz, female   DOB: Nov 07, 2000, 20 y.o.   MRN: [id number]  Comfortable w epidural; IOL began 10/30 with cytotec/FB and Pit initially started overnight; is currently on a Pit break since 0645 d/t FHR decels (had only gotten up to 22mu/min); SROM at 1920 last PM  BP 110/50, 110/61, P 102, T 99 FHR 130s, +accels, no decels Ctx irreg 7-8 mins Cx 6/90/vtx -2  CBG at 0700: 132 after juice, followed by 121 (all <120 since admit previously)  IUP@40 .0wks GDMA2 Protracted active labor 2/2 inadequate ctx  IUPC inserted without difficulty; will restart Pitocin and attempt to reach adequate MVUs as long as the baby tolerates; no s/s infection currently  [name] [facility] 04/30/2021 9:42 AM

## 2021-04-30 NOTE — Progress Notes (Signed)
Patient ID: Donna Diaz, female   DOB: 2000/11/15, 20 y.o.   MRN: [id number]  Feeling more constant pressure  BP 112/64 FHR 140s, +accels, +LTV, some variables Ctx 2-4 mins, some coupling, Pit at 61mu/min with adequate MVUs Cx ant lip/90/vtx -1  CBG 87  IUP@40 .0wks GDMA2 Active labor/transition  Continue in various positions and use epidural PCA for comfort Hopeful for vag del  [name] CNM 04/30/2021 8:21 PM

## 2021-04-30 NOTE — Progress Notes (Signed)
Pharmacy Antibiotic Note  Donna Diaz is a 20 y.o. female admitted on 04/28/2021 for IOL for A2GDM.  Patient now with maternal fever and suspected chorioamnionitis.  Pharmacy has been consulted for gentamicin dosing.  Plan: Gentamicin 5mg /kg adjusted BW Q24 hours  Height: 5\' 2"  (157.5 cm) Weight: 105.7 kg (233 lb) IBW/kg (Calculated) : 50.1  Temp (24hrs), Avg:98.5 F (36.9 C), Min:97.5 F (36.4 C), Max:100.5 F (38.1 C)  Recent Labs  Lab 04/28/21 1256  WBC 7.7    CrCl cannot be calculated (Patient's most recent lab result is older than the maximum 21 days allowed.).    No Known Allergies  Antimicrobials this admission: Ampicillin 2g Q6 11/1  >>  04/30/21 5mg /kg Q24 11/1 >>    Thank you for allowing pharmacy to be a part of this patient's care.  Natasha Bence 04/30/2021 11:13 PM

## 2021-04-30 NOTE — Progress Notes (Signed)
Patient ID: Donna Diaz, female   DOB: August 27, 2000, 20 y.o.   MRN: [id number]  Comfortable w epidural; sitting in high fowlers; feeling a slight bit more of vag pressure  BP 115/63, P 89 FHR 130s, +accels, occ variables Ctx q 2-4 mins with Pit at 35mu/min; MVUs just under adequate Cx deferred  CBG 106  IUP@40 .0wks GDMA2  Protracted active phase due to inadequate ctx  Continue to titrate Pitocin to adequate labor, and then plan cx recheck after 2hrs of adequate ctx Anticipate vag del  [name] CNM 04/30/2021

## 2021-04-30 NOTE — Progress Notes (Signed)
Patient Vitals for the past 4 hrs:  BP Temp Temp src Pulse Resp  04/30/21 0630 122/67 -- -- (!) 104 --  04/30/21 0532 (!) 111/58 99 F (37.2 C) Oral (!) 102 20  04/30/21 0530 (!) 110/38 -- -- (!) 109 --  04/30/21 0501 (!) 108/52 -- -- 100 --  04/30/21 0430 123/67 -- -- (!) 104 --  04/30/21 0400 122/69 -- -- 96 --  04/30/21 0330 126/76 -- -- 95 --  04/30/21 0300 128/70 -- -- 98 --  . FHR has been intermittently Cat 2 (minimal variability, occ late decel, occ early, occ variable).  Responds to position change and IVF bolus.  Cx 6/90/-1 around 0500, ctx q 3-5 minutes.  Discussed w/Dr. Para March. Will turn off pitocin (at 2 mu/min) and allow baby to rest for a few hours then restart and attempt titrating up to adequate labor. Pt agreeable.

## 2021-05-01 ENCOUNTER — Encounter (HOSPITAL_COMMUNITY): Payer: Self-pay | Admitting: Obstetrics and Gynecology

## 2021-05-01 DIAGNOSIS — Z3A40 40 weeks gestation of pregnancy: Secondary | ICD-10-CM

## 2021-05-01 DIAGNOSIS — O24113 Pre-existing diabetes mellitus, type 2, in pregnancy, third trimester: Secondary | ICD-10-CM | POA: Diagnosis not present

## 2021-05-01 DIAGNOSIS — O99824 Streptococcus B carrier state complicating childbirth: Secondary | ICD-10-CM | POA: Diagnosis not present

## 2021-05-01 LAB — GLUCOSE, CAPILLARY: Glucose-Capillary: 93 mg/dL (ref 70–99)

## 2021-05-01 MED ORDER — BISACODYL 10 MG RE SUPP: 10.0000 mg | Freq: Every day | RECTAL | Status: DC | PRN

## 2021-05-01 MED ORDER — TETANUS-DIPHTH-ACELL PERTUSSIS 5-2.5-18.5 LF-MCG/0.5 IM SUSY: 0.5000 mL | PREFILLED_SYRINGE | Freq: Once | INTRAMUSCULAR | Status: DC

## 2021-05-01 MED ORDER — SODIUM CHLORIDE 0.9 % IV SOLN: 250.0000 mL | INTRAVENOUS | Status: DC | PRN

## 2021-05-01 MED ORDER — SIMETHICONE 80 MG PO CHEW: 80.0000 mg | CHEWABLE_TABLET | ORAL | Status: DC | PRN

## 2021-05-01 MED ORDER — ONDANSETRON HCL 4 MG PO TABS: 4.0000 mg | ORAL_TABLET | ORAL | Status: DC | PRN

## 2021-05-01 MED ORDER — POLYSACCHARIDE IRON COMPLEX 150 MG PO CAPS
150.0000 mg | ORAL_CAPSULE | ORAL | Status: DC
  Administered 2021-05-01: 150 mg via ORAL
  Filled 2021-05-01: qty 1

## 2021-05-01 MED ORDER — ACETAMINOPHEN 325 MG PO TABS
650.0000 mg | ORAL_TABLET | ORAL | Status: DC | PRN
  Administered 2021-05-01: 650 mg via ORAL
  Filled 2021-05-01: qty 2

## 2021-05-01 MED ORDER — INFLUENZA VAC SPLIT QUAD 0.5 ML IM SUSY
0.5000 mL | PREFILLED_SYRINGE | INTRAMUSCULAR | Status: AC
  Administered 2021-05-02: 0.5 mL via INTRAMUSCULAR
  Filled 2021-05-01: qty 0.5

## 2021-05-01 MED ORDER — ZOLPIDEM TARTRATE 5 MG PO TABS: 5.0000 mg | ORAL_TABLET | Freq: Every evening | ORAL | Status: DC | PRN

## 2021-05-01 MED ORDER — BENZOCAINE-MENTHOL 20-0.5 % EX AERO
1.0000 "application " | INHALATION_SPRAY | CUTANEOUS | Status: DC | PRN
  Administered 2021-05-01: 1 via TOPICAL
  Filled 2021-05-01: qty 56

## 2021-05-01 MED ORDER — DIPHENHYDRAMINE HCL 25 MG PO CAPS: 25.0000 mg | ORAL_CAPSULE | Freq: Four times a day (QID) | ORAL | Status: DC | PRN

## 2021-05-01 MED ORDER — MEASLES, MUMPS & RUBELLA VAC IJ SOLR: 0.5000 mL | Freq: Once | INTRAMUSCULAR | Status: DC

## 2021-05-01 MED ORDER — SODIUM CHLORIDE 0.9% FLUSH: 3.0000 mL | Freq: Two times a day (BID) | INTRAVENOUS | Status: DC

## 2021-05-01 MED ORDER — COCONUT OIL OIL: 1.0000 "application " | TOPICAL_OIL | Status: DC | PRN

## 2021-05-01 MED ORDER — SENNOSIDES-DOCUSATE SODIUM 8.6-50 MG PO TABS
2.0000 | ORAL_TABLET | Freq: Every day | ORAL | Status: DC
  Administered 2021-05-02: 2 via ORAL
  Filled 2021-05-01: qty 2

## 2021-05-01 MED ORDER — SODIUM CHLORIDE 0.9% FLUSH: 3.0000 mL | INTRAVENOUS | Status: DC | PRN

## 2021-05-01 MED ORDER — ONDANSETRON HCL 4 MG/2ML IJ SOLN: 4.0000 mg | INTRAMUSCULAR | Status: DC | PRN

## 2021-05-01 MED ORDER — PRENATAL MULTIVITAMIN CH
1.0000 | ORAL_TABLET | Freq: Every day | ORAL | Status: DC
  Administered 2021-05-01 – 2021-05-02 (×2): 1 via ORAL
  Filled 2021-05-01 (×2): qty 1

## 2021-05-01 MED ORDER — DIBUCAINE (PERIANAL) 1 % EX OINT: 1.0000 "application " | TOPICAL_OINTMENT | CUTANEOUS | Status: DC | PRN

## 2021-05-01 MED ORDER — IBUPROFEN 600 MG PO TABS
600.0000 mg | ORAL_TABLET | Freq: Four times a day (QID) | ORAL | Status: DC
  Administered 2021-05-01 – 2021-05-02 (×6): 600 mg via ORAL
  Filled 2021-05-01 (×6): qty 1

## 2021-05-01 MED ORDER — FLEET ENEMA 7-19 GM/118ML RE ENEM: 1.0000 | ENEMA | Freq: Every day | RECTAL | Status: DC | PRN

## 2021-05-01 MED ORDER — WITCH HAZEL-GLYCERIN EX PADS: 1.0000 "application " | MEDICATED_PAD | CUTANEOUS | Status: DC | PRN

## 2021-05-01 NOTE — Lactation Note (Signed)
This note was copied from a baby's chart. Lactation Consultation Note  Patient Name: Donna Diaz Date: 05/01/2021 Reason for consult: Other (Comment) (Pump related) Age:21 hours  LC in to room to answer questions about pumping. Mother states pumping is comfortable but she is getting no colostrum. Talked about differences between latching a baby and using a pump. Encouraged to continue pumping every 3h. Mother will contact her insurance to find out is she qualifies for a pump. Talked about [facility] services and pump availability. LC will come back to room once mother has more information.  Parents inquired about feeding volume and baby's sleepy behavior. Discussed normal newborn behavior during first 25 HOL. Reviewed volumes, pacing when bottlefeeding, frequent burping and upright position.  Encouraged to contact [facility] services for questions or concerns.   Maternal Data Has patient been taught Hand Expression?: Yes Does the patient have breastfeeding experience prior to this delivery?: No  Feeding Mother's Current Feeding Choice: Breast Milk and Formula  Lactation Tools Discussed/Used Tools: Pump Flange Size: 27;Other (comment) (per mom comfortable) Breast pump type: Double-Electric Breast Pump;Manual Pump Education: Setup, frequency, and cleaning Reason for Pumping: mother's request Pumping frequency: q3 Pumped volume:  (unable to see colostrum, per mother.)  Interventions Interventions: DEBP;Hand pump;Education;Pace feeding;Breast feeding basics reviewed  Discharge Pump: DEBP;Manual WIC Program: Yes  Consult Status Consult Status: Follow-up Date: 05/02/21 Follow-up type: In-patient    [name] A Higuera Ancidey 05/01/2021, 4:26 PM

## 2021-05-01 NOTE — Discharge Summary (Signed)
Postpartum Discharge Summary     Patient Name: Donna Diaz DOB: 2000-07-31 MRN: [id number]  Date of admission: 04/28/2021 Delivery date:05/01/2021  Delivering provider: Wells Guiles R  Date of discharge: 05/02/2021  Admitting diagnosis: Gestational diabetes mellitus [O24.419] Intrauterine pregnancy: [redacted]w[redacted]d    Secondary diagnosis:  Active Problems:   MDD (major depressive disorder), severe (HCC)   GBS (group B Streptococcus carrier), +RV culture, currently pregnant   Gestational diabetes mellitus   Shoulder dystocia, delivered   Anemia affecting pregnancy  Additional problems: none   Discharge diagnosis: Term Pregnancy Delivered and GDM A2                                              Post partum procedures: none Augmentation: Pitocin, Cytotec, IP Foley, and SROM Complications: Intrauterine Inflammation or infection (Chorioamniotis), mild shoulder dystocia  Hospital course: Induction of Labor With Vaginal Delivery   20y.o. yo G1P0 at 430w1das admitted to the hospital 04/28/2021 for induction of labor.  Indication for induction: A2 DM.  Patient had a labor course remarkable for becoming febrile with Triple I dx and appropriate abx, as well as mild SD at delivery (see Delivery Summary). Membrane Rupture Time/Date: 7:20 PM ,04/29/2021   Delivery Method:Vaginal, Spontaneous  Episiotomy: None  Lacerations:  2nd degree  Details of delivery can be found in separate delivery note.  Patient had a routine postpartum course. She did not have a fasting CBG, however her CBGs during labor were essentially normal without metformin and she will have a GTT in the PP period. She expressed a desire to have her son circumcised- consent obtained and note placed on his chart. She will have a [facility] in the next 2 weeks. Patient is discharged home 05/02/21 per her request for early d/c as long as the baby can go as well. Depo is ordered for contraception if she decides to receive it  here.  Newborn Data: Birth date:05/01/2021  Birth time:3:49 AM  Gender:Female  Living status:Living  Apgars:7 ,9  Weight:3629 g (8lb)  Magnesium Sulfate received: No BMZ received: No Rhophylac:N/A MMR:N/A T-DaP:Given prenatally Flu: No Transfusion:Yes  Physical exam  Vitals:   05/01/21 1433 05/01/21 1835 05/01/21 2220 05/02/21 0626  BP: (!) 111/55 (!) 115/54 111/70 112/60  Pulse: 84 82 88   Resp: _0 Temp: 98.1 F (36.7 C) 98.1 F (36.7 C) 98.3 F (36.8 C) 98 F (36.7 C)  TempSrc: Oral  Oral Oral  SpO2: 98% 100%  100%  Weight:      Height:       General: alert and cooperative Lochia: appropriate Uterine Fundus: firm Incision: N/A DVT Evaluation: No evidence of DVT seen on physical exam. Labs: Lab Results  Component Value Date   WBC 7.7 04/28/2021   HGB 9.5 (L) 04/28/2021   HCT 32.5 (L) 04/28/2021   MCV 68.3 (L) 04/28/2021   PLT 363 04/28/2021   CMP Latest Ref Rng & Units 12/31/2020  Glucose 70 - 99 mg/dL 91  BUN 6 - 20 mg/dL <5(L)  Creatinine 0.44 - 1.00 mg/dL 0.50  Sodium 135 - 145 mmol/L 134(L)  Potassium 3.5 - 5.1 mmol/L 3.9  Chloride 98 - 111 mmol/L 104  CO2 22 - 32 mmol/L 22  Calcium 8.9 - 10.3 mg/dL 9.1  Total Protein 6.5 - 8.1 g/dL 6.5  Total Bilirubin  0.3 - 1.2 mg/dL 0.5  Alkaline Phos 38 - 126 U/L 50  AST 15 - 41 U/L 12(L)  ALT 0 - 44 U/L 12   Edinburgh Score: Edinburgh Postnatal Depression Scale Screening Tool 05/01/2021  I have been able to laugh and see the funny side of things. 0  I have looked forward with enjoyment to things. 0  I have blamed myself unnecessarily when things went wrong. 0  I have been anxious or worried for no good reason. 2  I have felt scared or panicky for no good reason. 2  Things have been getting on top of me. 0  I have been so unhappy that I have had difficulty sleeping. 0  I have felt sad or miserable. 0  I have been so unhappy that I have been crying. 0  The thought of harming myself has occurred to  me. 0  Edinburgh Postnatal Depression Scale Total 4     After visit meds:  Allergies as of 05/02/2021   No Known Allergies      Medication List     STOP taking these medications    Accu-Chek Guide test strip Generic drug: glucose blood   Accu-Chek Guide w/Device Kit   Accu-Chek Softclix Lancets lancets   acetaminophen 500 MG tablet Commonly known as: TYLENOL   aspirin EC 81 MG tablet   Gojji Weight Scale Misc   metFORMIN 500 MG tablet Commonly known as: GLUCOPHAGE       TAKE these medications    ibuprofen 600 MG tablet Commonly known as: ADVIL Take 1 tablet (600 mg total) by mouth every 6 (six) hours as needed.   iron polysaccharides 150 MG capsule Commonly known as: NIFEREX Take 1 capsule (150 mg total) by mouth every other day.   PRENATAL VITAMIN PO Take by mouth.         Discharge home in stable condition Infant Feeding: Bottle and Breast Infant Disposition:home with mother Discharge instruction: per After Visit Summary and Postpartum booklet. Activity: Advance as tolerated. Pelvic rest for 6 weeks.  Diet: routine diet Future Appointments: Future Appointments  Date Time Provider [place]  05/13/2021 10:00 AM Lynnea Ferrier, LCSW [facility] None  06/12/2021  8:15 AM Chancy Milroy, MD [place] None  06/12/2021  8:45 AM [facility] None   Follow up Visit: Roma Schanz, CNM  P [facility] Please schedule this patient for PP visit in: 4-6wks  High risk pregnancy complicated by: I3DT  Delivery mode:  SVD  Anticipated Birth Control:  Depo  PP Procedures needed: 2 hour GTT  Schedule Integrated [place] visit: yes  Provider: Any provider   05/02/2021 Myrtis Ser, CNM 7:18 AM

## 2021-05-01 NOTE — Lactation Note (Signed)
This note was copied from a baby's chart. Lactation Consultation Note  Patient Name: Donna Diaz [id number] Date: 05/01/2021 Reason for consult: Initial assessment;Term;Other (Comment);Primapara;1st time breastfeeding (per mom plans to pump and bottle feed and does not desire to feed the baby at the breast. LC reviewed supply and demand . the [facility] had set up a DEBP , per mom has pumped x 1 #27 F / comfortable.) Age:21 hours LC plan - Pump and bottle feed .  Maternal Data Has patient been taught Hand Expression?: Yes Does the patient have breastfeeding experience prior to this delivery?: No  Feeding Mother's Current Feeding Choice: Breast Milk and Formula Nipple Type: Slow - flow  LATCH Score                    Lactation Tools Discussed/Used Tools: Pump;Flanges Flange Size: 27;Other (comment) (per mom comfortable) Breast pump type: Double-Electric Breast Pump Pump Education: Milk Storage Pumping frequency: per mom x 1 - aware to pump 8-10 times a day , which can include power pump x 1 over 60 mins Pumped volume: 0 mL  Interventions Interventions: Assisted with latch;DEBP;LC Services brochure  Discharge WIC Program: Yes (Patient plans to check with her Medicaid to see if they provide DEBP . LC will F/U with mom for need of [facility] referral . per mom also may consider buying a DEBP)  Consult Status Consult Status: Follow-up Date: 05/02/21 Follow-up type: In-patient    Matilde Sprang Janeal Abadi 05/01/2021, 1:17 PM

## 2021-05-01 NOTE — Anesthesia Postprocedure Evaluation (Signed)
Anesthesia Post Note  Patient: Donna Diaz  Procedure(s) Performed: AN AD HOC LABOR EPIDURAL     Patient location during evaluation: Mother Baby Anesthesia Type: Epidural Level of consciousness: awake and alert Pain management: pain level controlled Vital Signs Assessment: post-procedure vital signs reviewed and stable Respiratory status: spontaneous breathing, nonlabored ventilation and respiratory function stable Cardiovascular status: stable Postop Assessment: no headache, no backache and epidural receding Anesthetic complications: no   No notable events documented.  Last Vitals:  Vitals:   05/01/21 1119 05/01/21 1433  BP: (!) 103/55 (!) 111/55  Pulse: 92 84  Resp: 18 18  Temp: 36.5 C 36.7 C  SpO2: 100% 98%    Last Pain:  Vitals:   05/01/21 1433  TempSrc: Oral  PainSc: 0-No pain   Pain Goal:                   Jyquan Kenley

## 2021-05-01 NOTE — Plan of Care (Signed)
Pt delivered viable female. Care plan complete.

## 2021-05-01 NOTE — Progress Notes (Signed)
Patient ID: Donna Diaz, female   DOB: Sep 05, 2000, 20 y.o.   MRN: [id number] Donna Diaz is a 20 y.o. G1P0 at [redacted]w[redacted]d admitted for induction of labor due to diabetes mellitus A2DM   Subjective: no complaints  Objective: BP 131/81   Pulse 93   Temp (!) 100.5 F (38.1 C) (Axillary)   Resp 20   Ht 5\' 2"  (1.575 m)   Wt 105.7 kg   SpO2 99%   BMI 42.62 kg/m  Total I/O In: -  Out: 225 [Urine:225]  FHR baseline 145 bpm, Variability: moderate, Accelerations:not present, Decelerations:  Present  occ variable Toco: q 3-81mins   SVE:   Dilation: 10 Effacement (%): 90 Station: Plus 1 Exam by:: [email or web address] RN  Pitocin @ 16 mu/min   Labs: Lab Results  Component Value Date   WBC 7.7 04/28/2021   HGB 9.5 (L) 04/28/2021   HCT 32.5 (L) 04/28/2021   MCV 68.3 (L) 04/28/2021   PLT 363 04/28/2021    Assessment / Plan: IOL d/t A2DM, s/p cytotec, foley bulb, SROM, pitocin, complete at 2327, starting pushing at 2352. Tmax 100.5, amp/gent per protocol and apap for presumed Triple I   Labor:  2nd stage Fetal Wellbeing:  Category II Pain Control:  epidural Pre-eclampsia: N/A I/D:  Amp & Gent for Triple I/GBS+ Anticipated MOD: NSVB  2328 CNM, WHNP-BC 05/01/2021, 12:20 AM

## 2021-05-02 ENCOUNTER — Encounter: Payer: Medicaid Other | Admitting: Obstetrics and Gynecology

## 2021-05-02 DIAGNOSIS — O99019 Anemia complicating pregnancy, unspecified trimester: Secondary | ICD-10-CM | POA: Diagnosis present

## 2021-05-02 LAB — SURGICAL PATHOLOGY

## 2021-05-02 MED ORDER — MEDROXYPROGESTERONE ACETATE 150 MG/ML IM SUSP
150.0000 mg | Freq: Once | INTRAMUSCULAR | Status: AC
  Administered 2021-05-02: 150 mg via INTRAMUSCULAR
  Filled 2021-05-02: qty 1

## 2021-05-02 MED ORDER — IBUPROFEN 600 MG PO TABS: 600.0000 mg | ORAL_TABLET | Freq: Four times a day (QID) | ORAL | 0 refills | Status: AC | PRN

## 2021-05-02 NOTE — Progress Notes (Signed)
Post Partum Day 1 Subjective: Patient is recovering well and meeting all milestones. She is ambulating, passing urine and flatus. She denies headaches, visual changes, nausea and vomiting.  Objective: Blood pressure 112/60, pulse 88, temperature 98 F (36.7 C), temperature source Oral, resp. rate 18, height 5\' 2"  (1.575 m), weight 105.7 kg, SpO2 100 %, unknown if currently breastfeeding.  Physical Exam:  General: alert and no distress Lochia: appropriate Uterine Fundus: firm DVT Evaluation: No evidence of DVT seen on physical exam.  No results for input(s): HGB, HCT in the last 72 hours.  Assessment/Plan: Donna Diaz is a 20 y.o. G1P1001 on PPD1. She is recovering well and wants to receive the flu shot, depo shot and requests that her child is circumcised. If we can get this completed she will discharge today.   LOS: 4 days   26 05/02/2021, 8:58 AM

## 2021-05-13 ENCOUNTER — Institutional Professional Consult (permissible substitution): Payer: Medicaid Other | Admitting: Licensed Clinical Social Worker

## 2021-05-14 ENCOUNTER — Ambulatory Visit (INDEPENDENT_AMBULATORY_CARE_PROVIDER_SITE_OTHER): Payer: Medicaid Other | Admitting: Licensed Clinical Social Worker

## 2021-05-14 DIAGNOSIS — Z8659 Personal history of other mental and behavioral disorders: Secondary | ICD-10-CM

## 2021-05-15 NOTE — BH Specialist Note (Signed)
Integrated Behavioral Health Follow Up In-Person Visit  MRN: [id number] Name: Donna Diaz  Number of Integrated Behavioral Health Clinician visits: 2/6 Session Start time: 1:08pm  Session End time: 1:45pm Total time: 37 minutes via mychart video   Types of Service: Individual psychotherapy  Interpretor:No. Interpretor Name and Language: none  Subjective: Catricia Diaz is a 20 y.o. female accompanied by n/a Patient was referred by hospital discharge for history of depression. Patient reports the following symptoms/concerns: history of depression Duration of problem: approx one year ; Severity of problem: mild  Objective: Mood: good and Affect: Appropriate Risk of harm to self or others: No plan to harm self or others  Life Context: Family and Social: Lives with spouse and immediate family in [place]  School/Work: n/a Self-Care: none  Life Changes: new baby  Patient and/or Family's Strengths/Protective Factors: Concrete supports in place (healthy food, safe environments, etc.)  Goals Addressed: Patient will:  Reduce symptoms of: depression   Increase knowledge and/or ability of: coping skills   Demonstrate ability to: Increase healthy adjustment to current life circumstances and Increase adequate support systems for patient/family  Progress towards Goals: Ongoing  Interventions: Interventions utilized:  Supportive Counseling Standardized Assessments completed: PHQ 9  Patient and/or Family Response: Ms. Schexnider responded well to scheduled appt.   Assessment: Patient reports history of depression. Donna Diaz reports she is bonding well with newborn and immediate family is very supportive.    Patient may benefit from integrated behavioral health.  Plan: Follow up with behavioral health clinician on : as needed  Behavioral recommendations: Schedule and prioritize self care and rest to prevent burnout, delegate parenting task with spouse and begin breathing and  mindfulness exercises  Referral(s): Integrated [employer] (In Clinic) "From scale of 1-10, how likely are you to follow plan?":    Donna Saxon, LCSW

## 2021-05-16 ENCOUNTER — Telehealth (HOSPITAL_COMMUNITY): Payer: Self-pay | Admitting: *Deleted

## 2021-05-16 NOTE — Telephone Encounter (Signed)
Left message to return nurse call.  Duffy Rhody, RN 05-16-2021 at 10:29am

## 2021-06-12 ENCOUNTER — Encounter: Payer: Self-pay | Admitting: Obstetrics and Gynecology

## 2021-06-12 ENCOUNTER — Ambulatory Visit (INDEPENDENT_AMBULATORY_CARE_PROVIDER_SITE_OTHER): Payer: Medicaid Other | Admitting: Obstetrics and Gynecology

## 2021-06-12 ENCOUNTER — Other Ambulatory Visit: Payer: Medicaid Other

## 2021-06-12 ENCOUNTER — Other Ambulatory Visit: Payer: Self-pay

## 2021-06-12 VITALS — BP 133/78 | HR 76 | Ht 62.0 in | Wt 211.6 lb

## 2021-06-12 DIAGNOSIS — O24439 Gestational diabetes mellitus in the puerperium, unspecified control: Secondary | ICD-10-CM | POA: Diagnosis not present

## 2021-06-12 DIAGNOSIS — O24419 Gestational diabetes mellitus in pregnancy, unspecified control: Secondary | ICD-10-CM

## 2021-06-12 NOTE — Patient Instructions (Signed)

## 2021-06-12 NOTE — Progress Notes (Signed)
° ° °  Post Partum Visit Note  Donna Diaz is a 20 y.o. [id number] female who presents for a postpartum visit. She is 6 weeks postpartum following a normal spontaneous vaginal delivery.  I have fully reviewed the prenatal and intrapartum course. The delivery was at 40.1 gestational weeks.  Anesthesia: epidural. Postpartum course has been uncomplicated. Baby is doing well: yes. Baby is feeding by bottle - Ger[employer] Gent[employer] Good Start . Bleeding no bleeding. Bowel function is normal. Bladder function is normal. Patient is not sexually active. Contraception method is Depo-Provera injections. Postpartum depression screening: positive. Referral through pediatrician's office to psych.   The pregnancy intention screening data noted above was reviewed. Potential methods of contraception were discussed. The patient elected to proceed with No data recorded.    Health Maintenance Due  Topic Date Due   COVID-19 Vaccine (1) Never done   Pneumococcal Vaccine 31-52 Years old (1 - PCV) Never done   URINE MICROALBUMIN  Never done   PAP-Cervical Cytology Screening  05/12/2021   PAP SMEAR-Modifier  05/12/2021    Medical Records  Review of Systems Pertinent items are noted in HPI.  Objective:  There were no vitals taken for this visit.   General:  alert   Breasts:  not indicated  Lungs: clear to auscultation bilaterally  Heart:  regular rate and rhythm, S1, S2 normal, no murmur, click, rub or gallop  Abdomen: soft, non-tender; bowel sounds normal; no masses,  no organomegaly   Wound NA  GU exam:  not indicated       Assessment:    There are no diagnoses linked to this encounter.  Nl postpartum exam.  H/O GDM  Plan:   Essential components of care per ACOG recommendations:  1.  Mood and well being: Patient with negative depression screening today. Reviewed local resources for support.  - Patient tobacco use? No.   - hx of drug use? No.    2. Infant care and feeding:  -Patient currently breastmilk  feeding? No.  -Social determinants of health (SDOH) reviewed in EPIC. No concerns  3. Sexuality, contraception and birth spacing - Patient does not want a pregnancy in the next year.  Desired family size is uncertain  - Reviewed forms of contraception in tiered fashion. Patient desired Depo-Provera today.   - Discussed birth spacing of 18 months  4. Sleep and fatigue -Encouraged family/partner/community support of 4 hrs of uninterrupted sleep to help with mood and fatigue  5. Physical Recovery  - Discussed patients delivery and complications. She describes her labor as good. - Patient had a Vaginal, no problems at delivery.  - Patient has urinary incontinence? No. - Patient is safe to resume physical and sexual activity  6.  Health Maintenance - HM due items addressed  - Last pap smear No results found for: DIAGPAP Pap smear not done at today's visit.  -Breast Cancer screening indicated? No.   7. Chronic Disease/Pregnancy Condition follow up: None  Glucola today due to H/O GDM  Nettie Elm, MD [facility] [employer], [facility]

## 2021-06-13 LAB — GLUCOSE TOLERANCE, 2 HOURS
Glucose, 2 hour: 153 mg/dL — ABNORMAL HIGH (ref 70–139)
Glucose, GTT - Fasting: 120 mg/dL — ABNORMAL HIGH (ref 70–99)

## 2021-06-26 ENCOUNTER — Other Ambulatory Visit: Payer: Self-pay

## 2021-06-26 DIAGNOSIS — E119 Type 2 diabetes mellitus without complications: Secondary | ICD-10-CM

## 2021-06-26 MED ORDER — METFORMIN HCL 500 MG PO TABS: 500.0000 mg | ORAL_TABLET | Freq: Two times a day (BID) | ORAL | 1 refills | Status: AC

## 2021-07-24 ENCOUNTER — Other Ambulatory Visit: Payer: Self-pay

## 2021-07-24 ENCOUNTER — Ambulatory Visit (INDEPENDENT_AMBULATORY_CARE_PROVIDER_SITE_OTHER): Payer: Medicaid Other

## 2021-07-24 DIAGNOSIS — Z3042 Encounter for surveillance of injectable contraceptive: Secondary | ICD-10-CM | POA: Diagnosis not present

## 2021-07-24 MED ORDER — MEDROXYPROGESTERONE ACETATE 150 MG/ML IM SUSP
150.0000 mg | Freq: Once | INTRAMUSCULAR | Status: AC
  Administered 2021-07-24: 14:00:00 150 mg via INTRAMUSCULAR

## 2021-07-24 MED ORDER — MEDROXYPROGESTERONE ACETATE 150 MG/ML IM SUSP: 150.0000 mg | INTRAMUSCULAR | 3 refills | Status: AC

## 2021-07-24 NOTE — Progress Notes (Signed)
Patient presents for depo injection. Patient received first depo in the hospital on 11/3. Patient is within her window. Inj given in LD. Patient tolerated well. Next depo 4/12-4/26.  Patient given office stock today. Rx had not previously been sent to the pharmacy. Rx sent with 3 refills. Patient advised to bring her depo to her next appointment

## 2021-07-24 NOTE — Progress Notes (Signed)
Patient was assessed and managed by nursing staff during this encounter. I have reviewed the chart and agree with the documentation and plan. I have also made any necessary editorial changes.  Warden Fillers, MD 07/24/2021 2:57 PM

## 2021-09-11 ENCOUNTER — Other Ambulatory Visit: Payer: Self-pay | Admitting: Obstetrics and Gynecology

## 2021-09-11 DIAGNOSIS — E119 Type 2 diabetes mellitus without complications: Secondary | ICD-10-CM

## 2021-10-09 ENCOUNTER — Ambulatory Visit (INDEPENDENT_AMBULATORY_CARE_PROVIDER_SITE_OTHER): Payer: Medicaid Other | Admitting: Emergency Medicine

## 2021-10-09 VITALS — BP 129/73 | HR 86 | Wt 230.0 lb

## 2021-10-09 DIAGNOSIS — Z3042 Encounter for surveillance of injectable contraceptive: Secondary | ICD-10-CM

## 2021-10-09 MED ORDER — MEDROXYPROGESTERONE ACETATE 150 MG/ML IM SUSP
150.0000 mg | Freq: Once | INTRAMUSCULAR | Status: AC
  Administered 2021-10-09: 150 mg via INTRAMUSCULAR

## 2021-10-09 NOTE — Progress Notes (Signed)
Date last pap: Not Done- will need first one this year. ?Last Depo-Provera: 07/24/2021. ?Side Effects if any: reports night sweats, hot flashes. ?Serum HCG indicated? NA. ?Depo-Provera 150 mg IM given by: Resa Miner, RN in left deltoid, tolerated well, no adverse reactions noted. ?Next appointment due Jun 28-Jul 12.  ?

## 2021-12-25 ENCOUNTER — Ambulatory Visit: Payer: Medicaid Other

## 2022-02-28 IMAGING — US US MFM FETAL BPP W/O NON-STRESS
1 series · 15 of 21 positions shown · non-contrast
Comparison: none

[Series 1: us mfm fetal bpp w/o non-stress · 21 acquisitions, 15 frames shown]
[im 1/21]
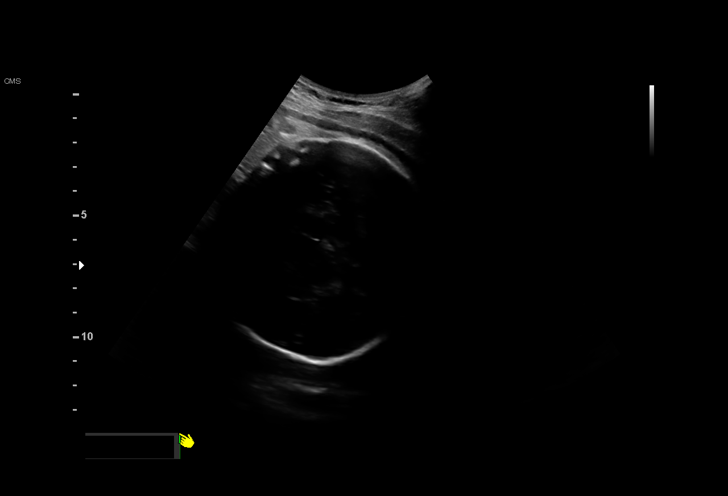
[im 3/21]
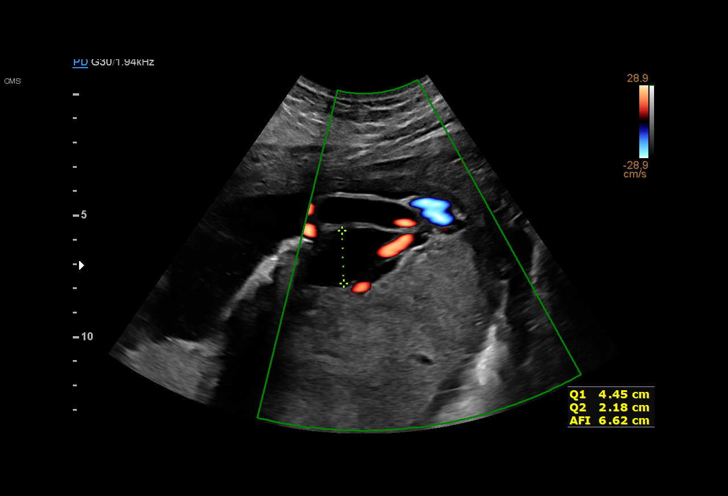
[im 4/21]
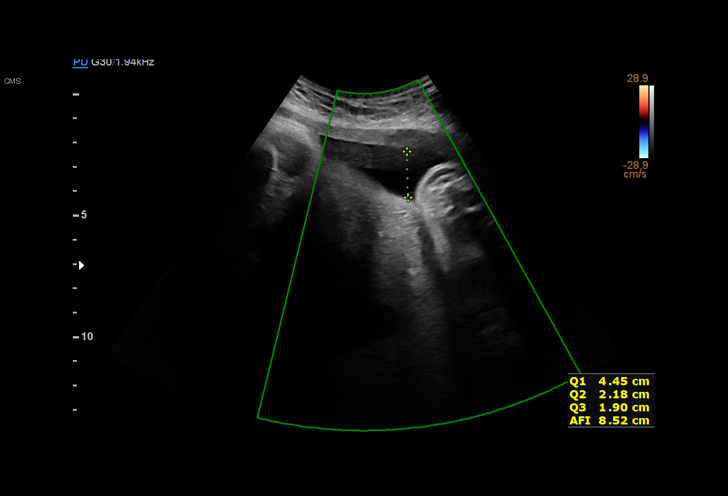
[im 5/21]
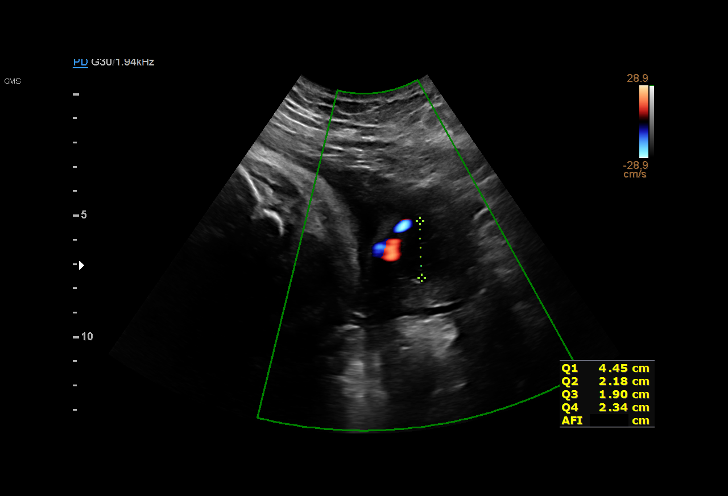
[im 7/21]
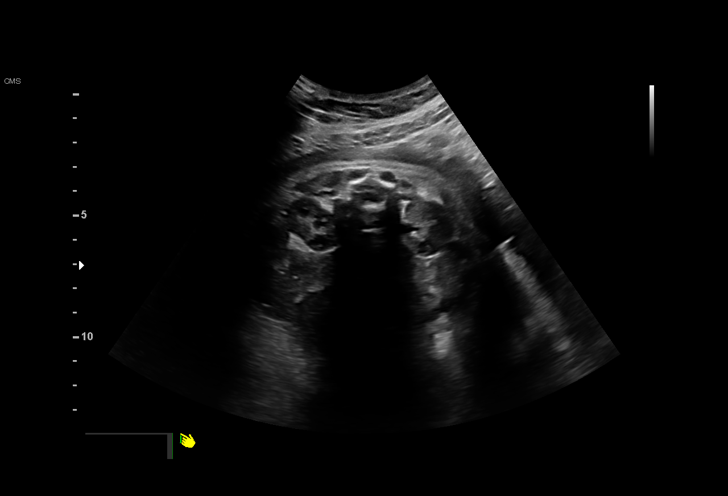
[im 8/21]
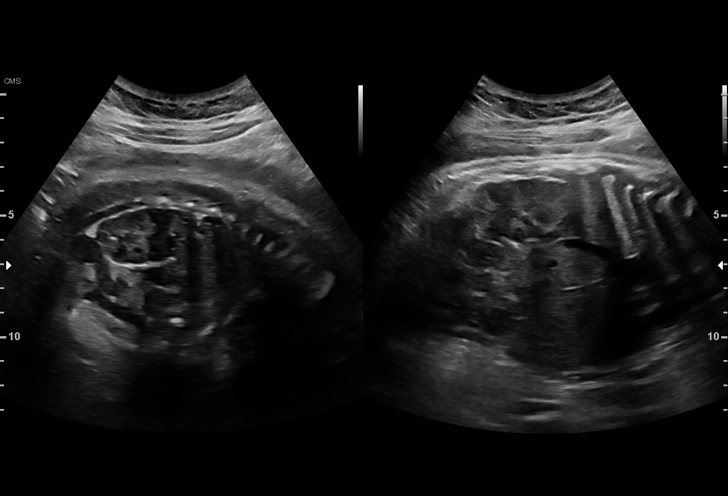
[im 10/21]
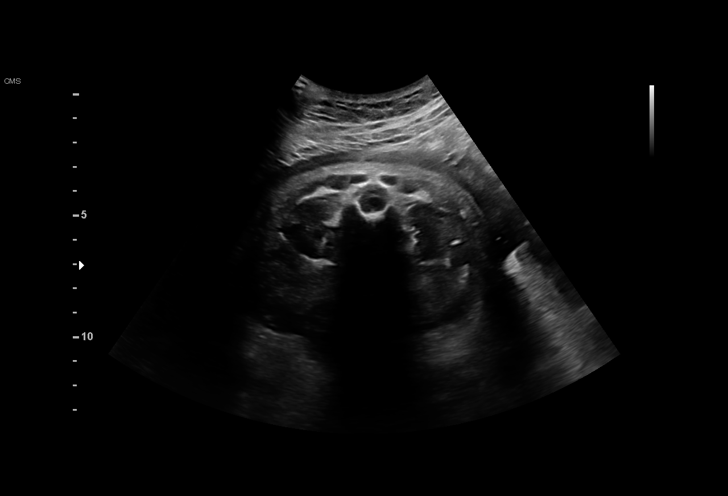
[im 11/21]
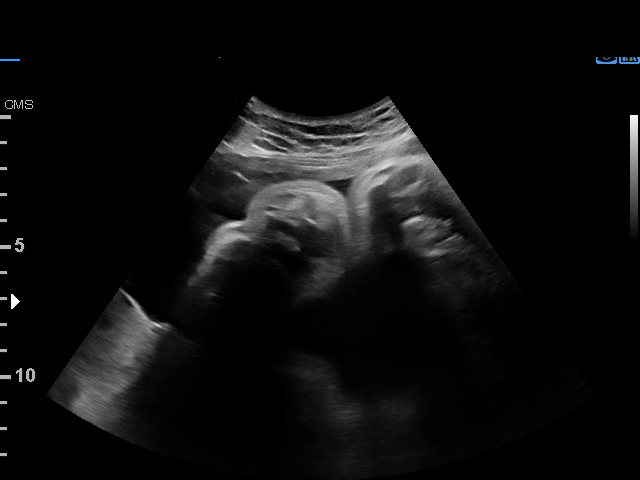
[im 12/21]
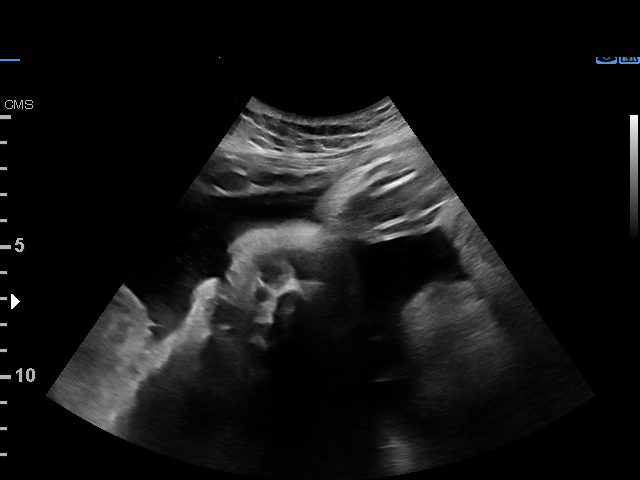
[im 14/21]
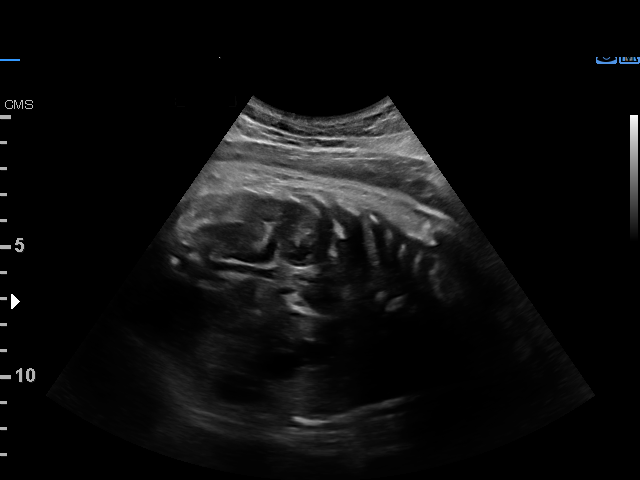
[im 15/21]
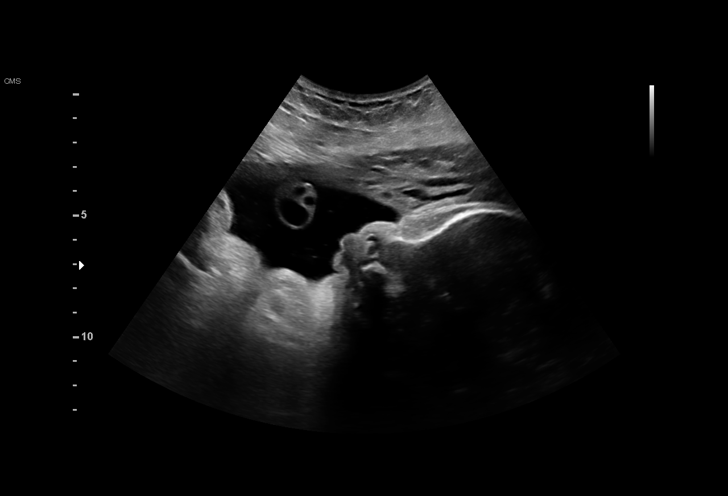
[im 17/21]
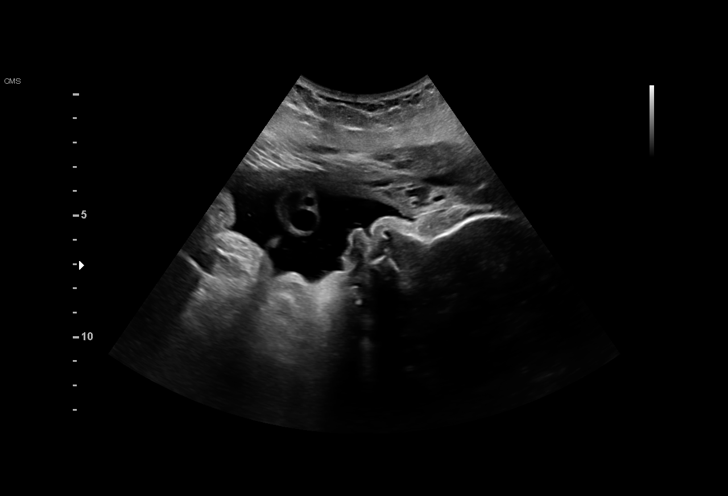
[im 18/21]
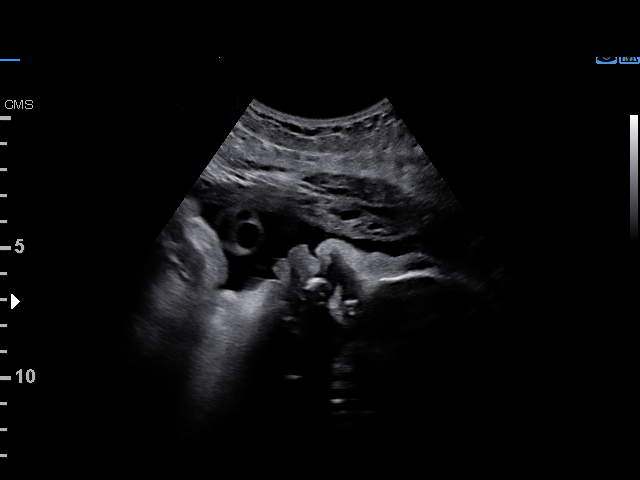
[im 19/21]
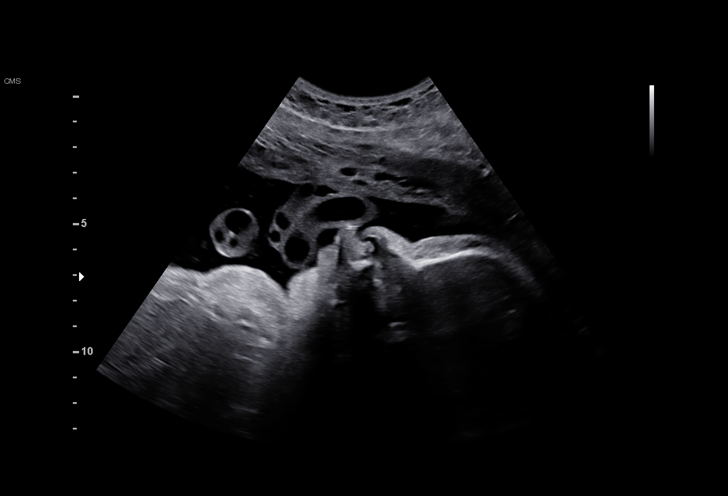
[im 21/21]
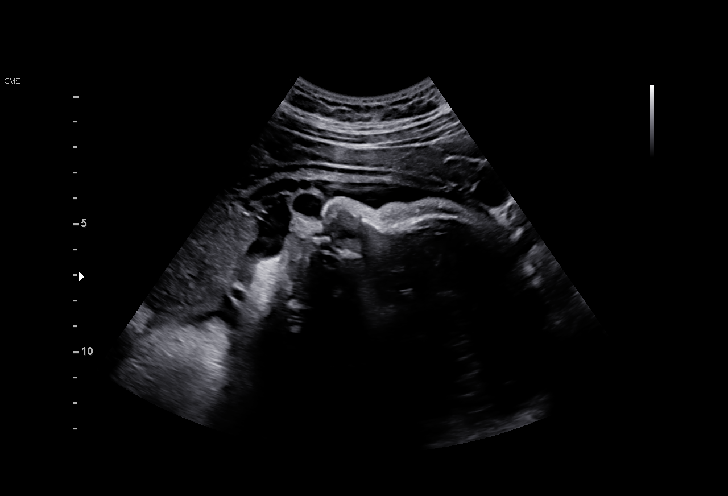

[15 of 21 positions shown; findings below may reference images not displayed]

[REDACTED]care

Indications

 37 weeks gestation of pregnancy
 Gestational diabetes in pregnancy,
 controlled by oral hypoglycemic drugs
 Velamentous insertion of umbilical cord
 Obesity complicating pregnancy, third
 trimester
 Encounter for other antenatal screening
 follow-up
Vital Signs

                                                Height:        5'2"
Fetal Evaluation

 Num Of Fetuses:         1
 Fetal Heart Rate(bpm):  143
 Cardiac Activity:       Observed
 Presentation:           Cephalic

 Amniotic Fluid
 AFI FV:      Within normal limits

 AFI Sum(cm)     %Tile       Largest Pocket(cm)
 10.9            31

 RUQ(cm)       RLQ(cm)       LUQ(cm)        LLQ(cm)

Biophysical Evaluation

 Amniotic F.V:   Within normal limits       F. Tone:        Observed
 F. Movement:    Observed                   Score:          [DATE]
 F. Breathing:   Observed
OB History

 Gravidity:    1
Gestational Age

 LMP:           37w 3d        Date:  07/24/20                 EDD:   04/30/21
 Best:          37w 3d     Det. By:  LMP  (07/24/20)          EDD:   04/30/21
Impression

 Gestational diabetes.  Patient reports that diabetes is well
 controlled on metformin.  She takes metformin 1,000 mg
 twice daily.

 Amniotic fluid is normal and good fetal activity is seen
 .Antenatal testing is reassuring. BPP [DATE].

 I reassured the patient of the findings.  We discussed timing
 of delivery.
Recommendations

 -Cancel your office NST/BPP on 04/15/2021.
 -Fetal growth assessment on 04/22/2021.
 -Delivery may be considered at 39 weeks gestation if patient
 is anxious.
                 Chery, Klaus

## 2022-03-10 IMAGING — US US MFM FETAL BPP W/O NON-STRESS
1 series · 14 of 28 positions shown · non-contrast
Comparison: none

[Series 1: us mfm fetal bpp w/o non-stress · 41 acquisitions, 14 frames shown]
[im 2/41]
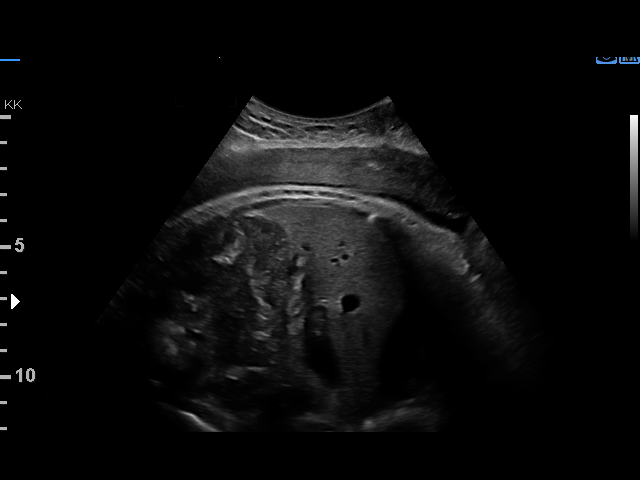
[im 5/41]
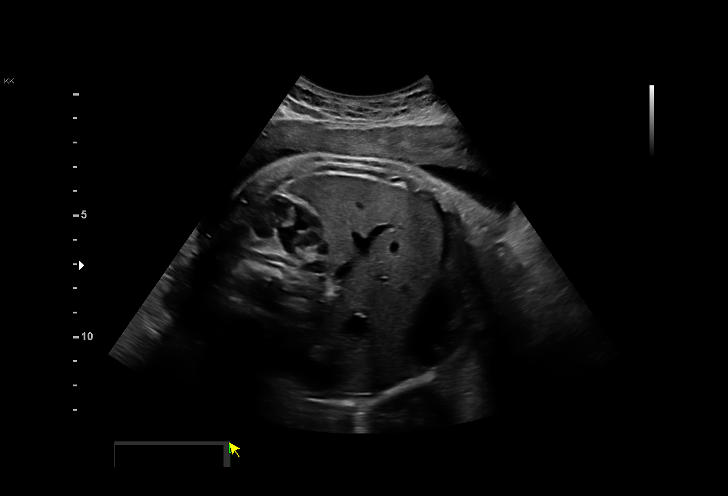
[im 8/41]
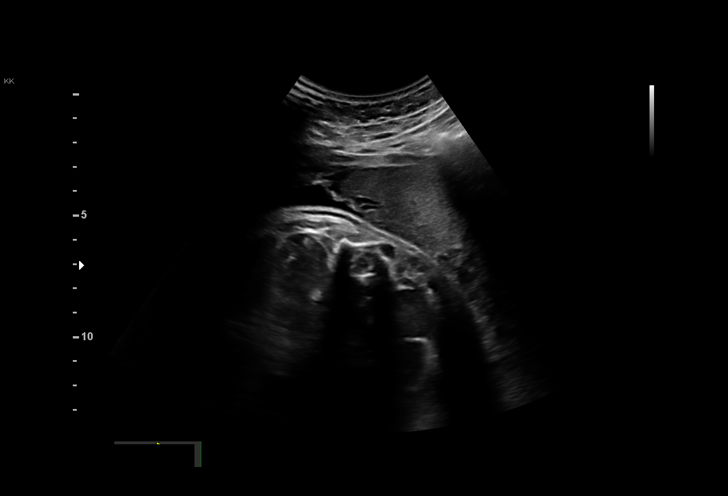
[im 11/41]
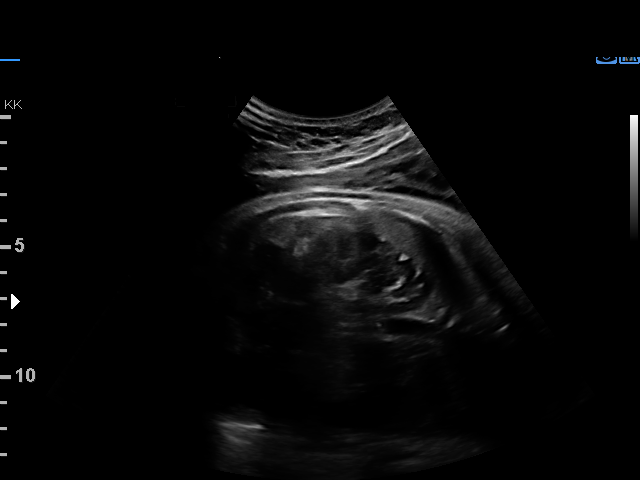
[im 14/41]
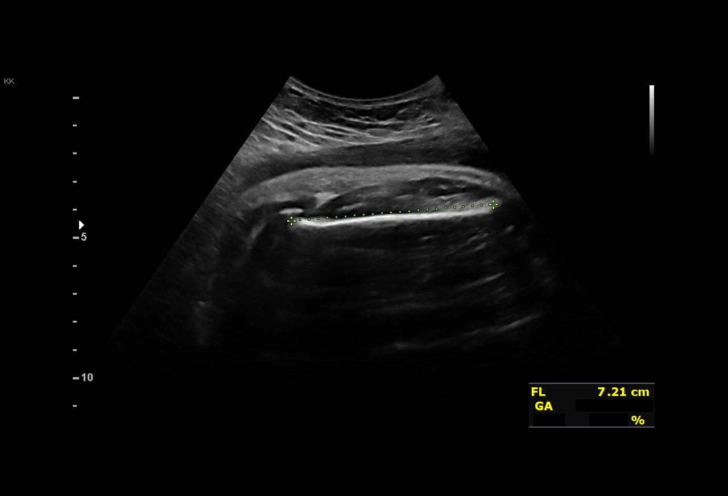
[im 17/41]
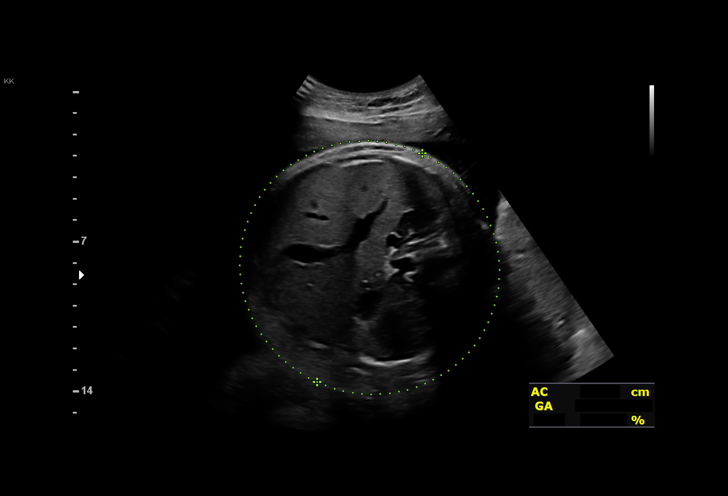
[im 20/41]
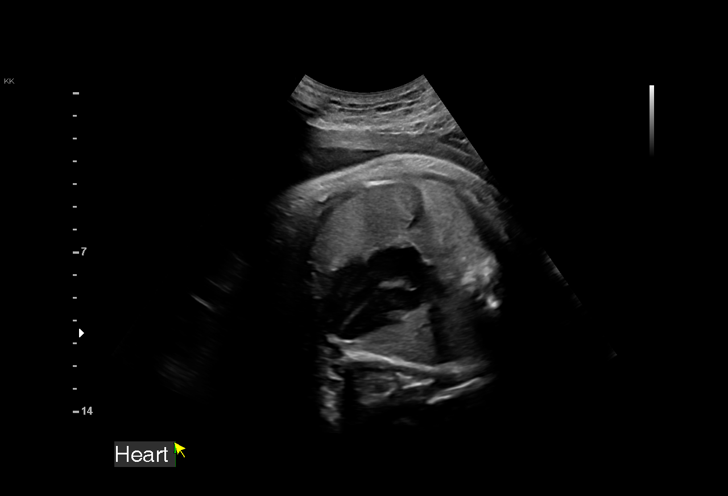
[im 23/41]
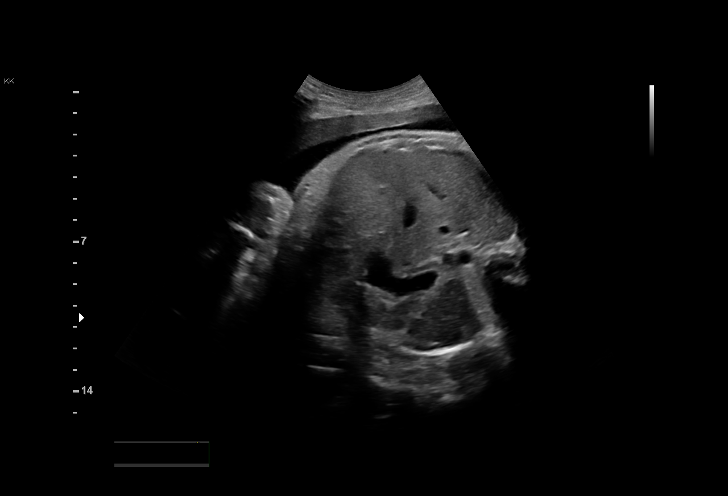
[im 26/41]
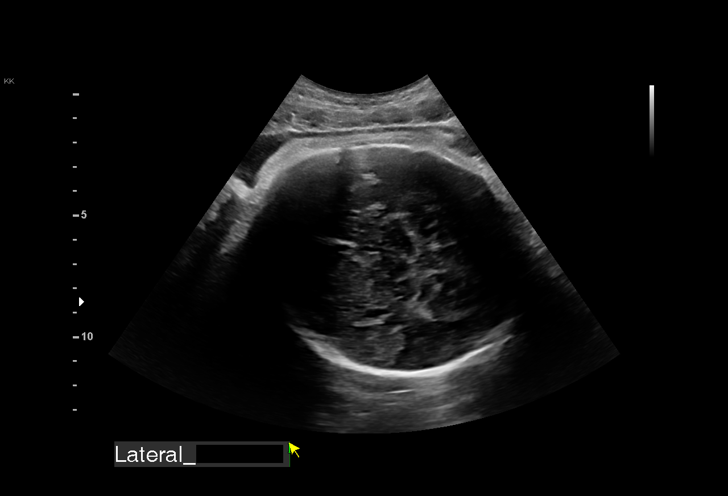
[im 29/41]
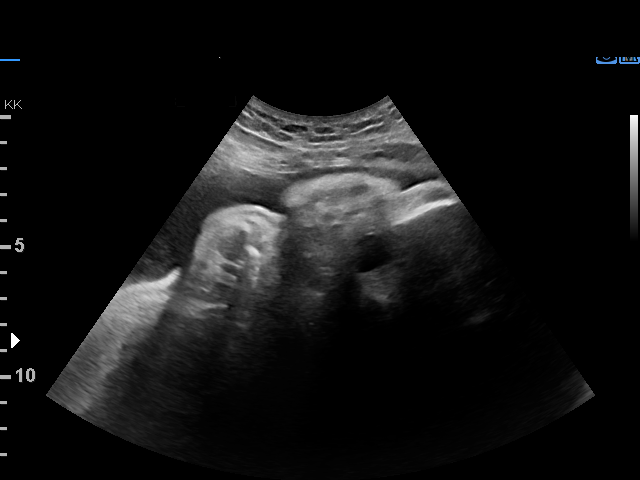
[im 32/41]
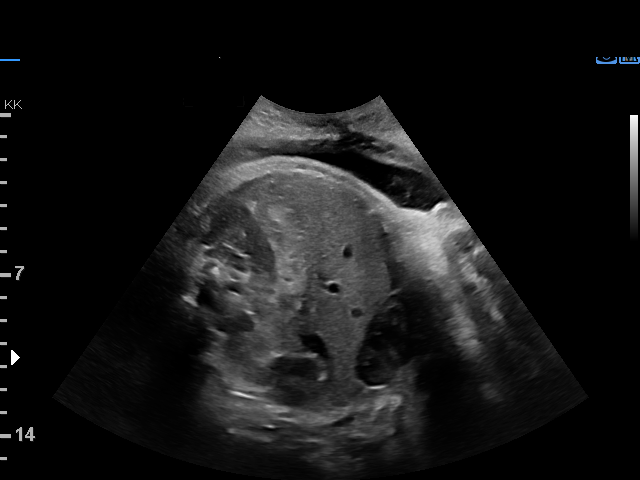
[im 35/41]
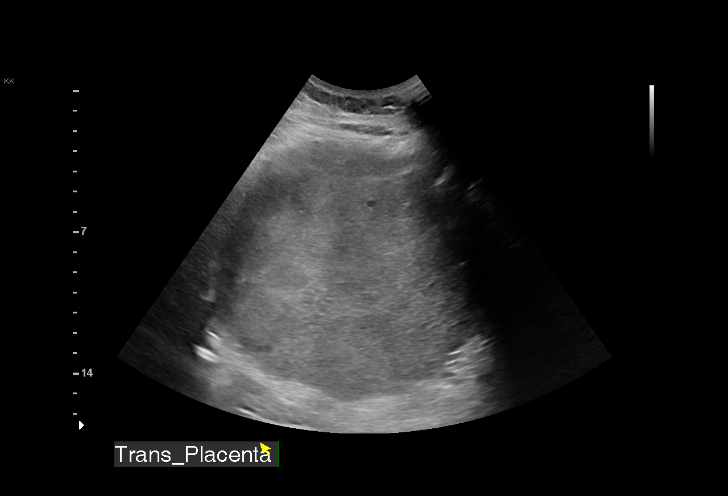
[im 38/41]
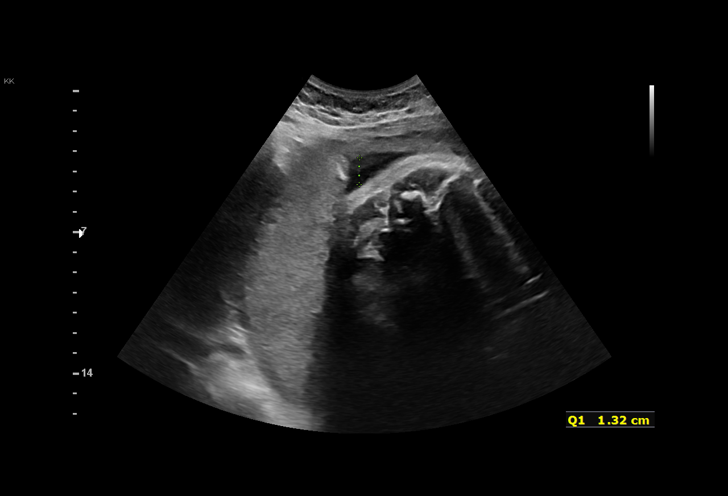
[im 41/41]
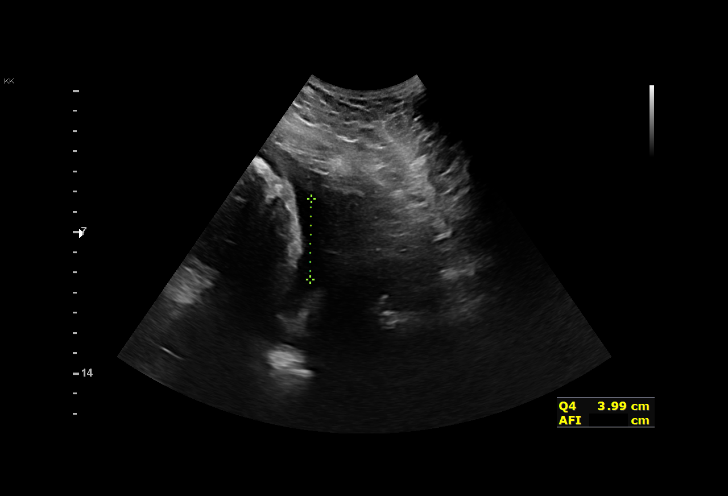

[14 of 28 positions shown; findings below may reference images not displayed]

[REDACTED]care

Indications

 Gestational diabetes in pregnancy,
 controlled by oral hypoglycemic drugs
 Obesity complicating pregnancy, third
 trimester(BMI 42)
 38 weeks gestation of pregnancy
 Velamentous insertion of umbilical cord
 Encounter for other antenatal screening
 follow-up
 Low Risk NIPS
Vital Signs

                                                Height:        5'2"
Fetal Evaluation

 Num Of Fetuses:         1
 Fetal Heart Rate(bpm):  157
 Cardiac Activity:       Observed
 Presentation:           Cephalic
 Placenta:               Posterior
 P. Cord Insertion:      Previously Visualized
 Amniotic Fluid
 AFI FV:      Within normal limits

 AFI Sum(cm)     %Tile       Largest Pocket(cm)
 13.9            55

 RUQ(cm)       RLQ(cm)       LUQ(cm)        LLQ(cm)
 1.3           4
Biophysical Evaluation

 Amniotic F.V:   Pocket => 2 cm             F. Tone:        Observed
 F. Movement:    Observed                   Score:          [DATE]
 F. Breathing:   Observed
Biometry

 BPD:      94.2  mm     G. Age:  38w 3d         69  %    CI:        77.19   %    70 - 86
                                                         FL/HC:      21.5   %    20.6 -
 HC:      339.5  mm     G. Age:  39w 0d         41  %    HC/AC:      0.92        0.87 -
 AC:       371   mm     G. Age:  41w 0d         99  %    FL/BPD:     77.5   %    71 - 87
 FL:         73  mm     G. Age:  37w 3d         20  %    FL/AC:      19.7   %    20 - 24

 Est. FW:    4089  gm      8 lb 9 oz     86  %
OB History

 Gravidity:    1
Gestational Age

 LMP:           38w 6d        Date:  07/24/20                 EDD:   04/30/21
 U/S Today:     39w 0d                                        EDD:   04/29/21
 Best:          38w 6d     Det. By:  LMP  (07/24/20)          EDD:   04/30/21
Anatomy

 Cranium:               Appears normal         Aortic Arch:            Previously seen
 Cavum:                 Previously seen        Ductal Arch:            Previously seen
 Ventricles:            Appears normal         Diaphragm:              Appears normal
 Choroid Plexus:        Previously seen        Stomach:                Appears normal, left
                                                                       sided
 Cerebellum:            Previously seen        Abdomen:                Previously seen
 Posterior Fossa:       Previously seen        Abdominal Wall:         Previously seen
 Nuchal Fold:           Not applicable (>20    Cord Vessels:           Previously seen
                        wks GA)
 Face:                  Orbits and profile     Kidneys:                Appear normal
                        previously seen
 Lips:                  Previously seen        Bladder:                Appears normal
 Thoracic:              Previously seen        Spine:                  Previously seen
 Heart:                 Appears normal         Upper Extremities:      Previously seen
                        (4CH, axis, and
                        situs)
 RVOT:                  Previously seen        Lower Extremities:      Previously seen
 LVOT:                  Previously seen

 Other:  Fetus appears to be a male. Nasal bone previously visualized.
         Technically difficult due to fetal position.
Cervix Uterus Adnexa

 Cervix
 Not visualized (advanced GA >17wks)
Comments

 This patient was seen for a follow up growth scan and BPP
 due to gestational diabetes that is treated with metformin.
 She reports that her fingerstick values have all been within
 normal limits.  She denies any problems since her last exam.
 She was informed that the fetal growth and amniotic fluid
 level appears appropriate for her gestational age.
 A BPP performed today was [DATE].
 Due to gestational diabetes, she is already scheduled for
 delivery in 6 days.
